# Patient Record
Sex: Female | Born: 1950 | Race: Black or African American | Hispanic: No | Marital: Single | Smoking: Never smoker
Health system: Southern US, Community
[De-identification: ages and names within clinical notes are randomized; demographics above are authoritative.]

## PROBLEM LIST (undated history)

## (undated) DIAGNOSIS — I10 Essential (primary) hypertension: Secondary | ICD-10-CM

## (undated) HISTORY — DX: Essential (primary) hypertension: I10

## (undated) HISTORY — PX: ABDOMINAL HYSTERECTOMY: SHX81

---

## 2005-07-04 ENCOUNTER — Ambulatory Visit (HOSPITAL_COMMUNITY): Admission: RE | Admit: 2005-07-04 | Discharge: 2005-07-04 | Payer: Self-pay | Admitting: Obstetrics & Gynecology

## 2005-07-10 ENCOUNTER — Ambulatory Visit (HOSPITAL_COMMUNITY): Admission: RE | Admit: 2005-07-10 | Discharge: 2005-07-10 | Payer: Self-pay | Admitting: Obstetrics & Gynecology

## 2005-07-27 ENCOUNTER — Encounter: Admission: RE | Admit: 2005-07-27 | Discharge: 2005-07-27 | Payer: Self-pay | Admitting: Obstetrics & Gynecology

## 2005-08-07 ENCOUNTER — Ambulatory Visit: Admission: RE | Admit: 2005-08-07 | Discharge: 2005-08-07 | Payer: Self-pay | Admitting: Gynecologic Oncology

## 2005-08-14 ENCOUNTER — Encounter (INDEPENDENT_AMBULATORY_CARE_PROVIDER_SITE_OTHER): Payer: Self-pay | Admitting: *Deleted

## 2005-08-14 ENCOUNTER — Inpatient Hospital Stay (HOSPITAL_COMMUNITY): Admission: RE | Admit: 2005-08-14 | Discharge: 2005-08-18 | Payer: Self-pay | Admitting: Obstetrics & Gynecology

## 2005-10-10 ENCOUNTER — Emergency Department (HOSPITAL_COMMUNITY): Admission: EM | Admit: 2005-10-10 | Discharge: 2005-10-10 | Payer: Self-pay | Admitting: Emergency Medicine

## 2007-06-25 ENCOUNTER — Encounter: Admission: RE | Admit: 2007-06-25 | Discharge: 2007-06-25 | Payer: Self-pay | Admitting: Family Medicine

## 2007-08-26 ENCOUNTER — Ambulatory Visit (HOSPITAL_COMMUNITY): Admission: RE | Admit: 2007-08-26 | Discharge: 2007-08-26 | Payer: Self-pay | Admitting: General Surgery

## 2007-09-26 ENCOUNTER — Ambulatory Visit: Admission: RE | Admit: 2007-09-26 | Discharge: 2007-09-26 | Payer: Self-pay | Admitting: General Surgery
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
## 2007-11-28 ENCOUNTER — Inpatient Hospital Stay (HOSPITAL_COMMUNITY): Admission: AD | Admit: 2007-11-28 | Discharge: 2007-12-02 | Payer: Self-pay | Admitting: General Surgery

## 2007-11-28 ENCOUNTER — Encounter (HOSPITAL_BASED_OUTPATIENT_CLINIC_OR_DEPARTMENT_OTHER): Payer: Self-pay | Admitting: General Surgery

## 2009-12-20 IMAGING — CR DG CHEST 2V
2 series · 2 of 2 positions shown · non-contrast
Comparison: 08/09/2005 study.

CLINICAL DATA: History given of coughing, asthma, tobacco smoking.
Preoperative cardiopulmonary evaluation.

CHEST - 2 VIEW

[view not recorded (1 of 2)]
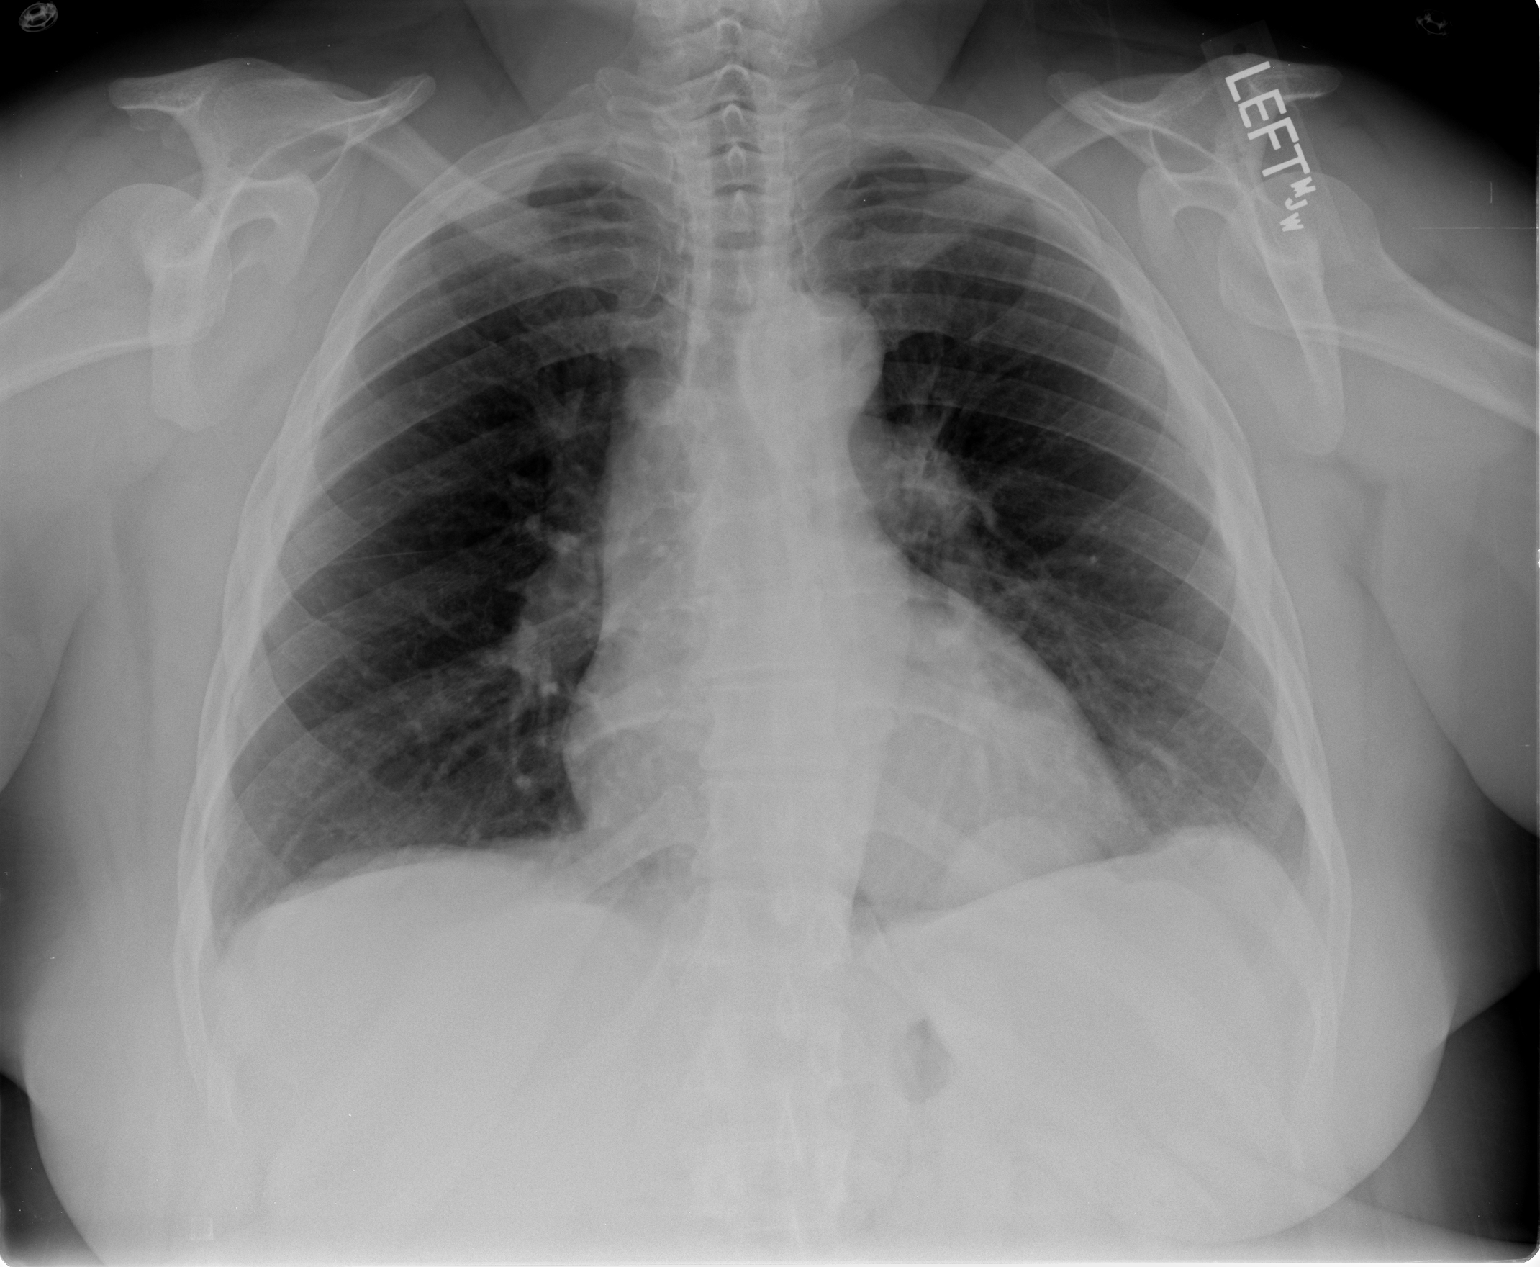

[view not recorded (2 of 2)]
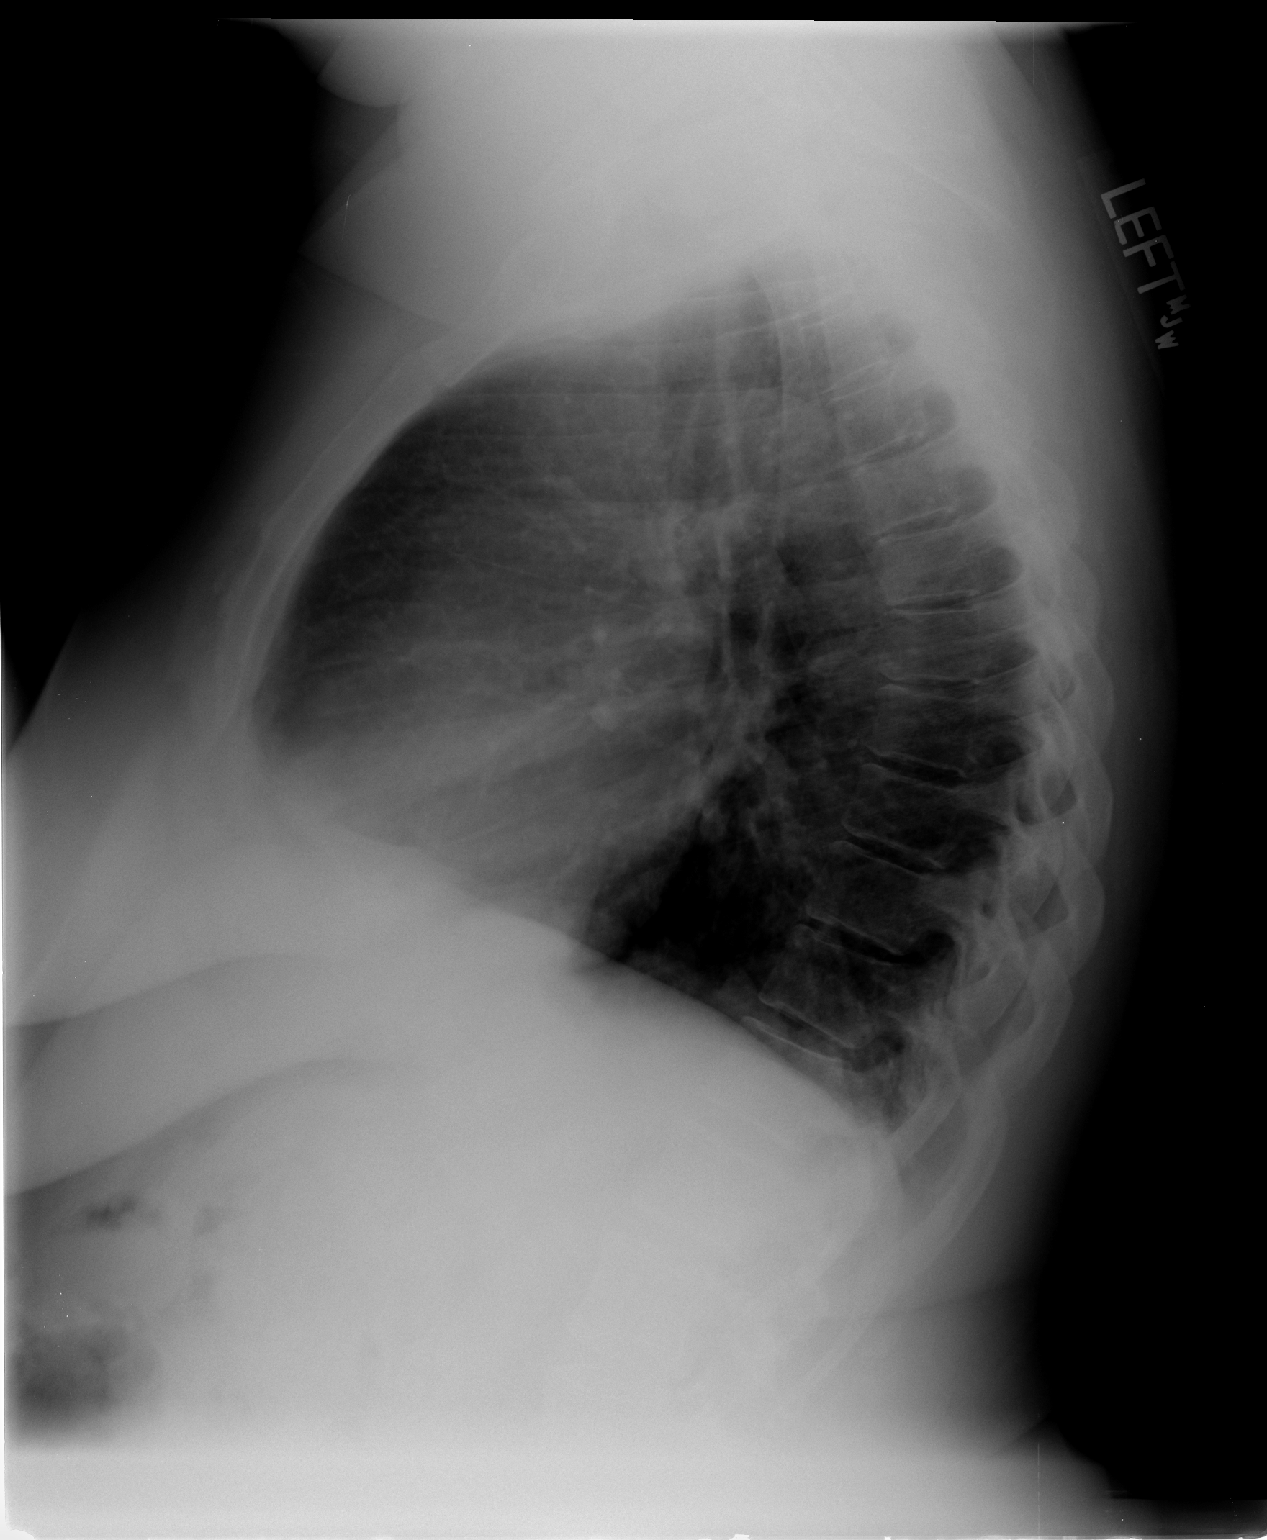

[2 of 2 positions shown; findings below may reference images not displayed]

FINDINGS: Cardiac silhouette is upper normal size.  No pulmonary
edema, pneumonia, or pleural effusion is seen.  There is a
generalized hyperinflation configuration.  There is flattening of
the diaphragm on lateral image.  There is minimal degenerative
spondylosis. On the PA image a structure projects superior to the
medial aspect of the left hemidiaphragm.  It has a curvilinear
smooth superior margin.  It measures 4.3 cm in transverse diameter.
I cannot definite identify this on the lateral image.  A CT was
performed on 06/25/2007.  On the sagittal images there is seen to
be protrusion of fat superiorly at the level of the posterior
aspect of the right hemidiaphragm .  On the coronal images there
appears to be a defect within the diaphragm with the fat protruding
through the defect consistent with a herniation of fat through the
posterior aspect of the left hemidiaphragm.  I feel this accounts
for the density seen on the chest radiographic examination.
IMPRESSION: Hyperinflation configuration with flattening of the diaphragm on
lateral image consistent with an element of obstructive pulmonary
disease.  No acute process identified. Left hemidiaphragmatic
posterior herniation of fat superiorly is seen.  This appears
unchanged from previous CT examination.

## 2010-02-05 ENCOUNTER — Encounter: Payer: Self-pay | Admitting: Obstetrics & Gynecology

## 2010-05-30 NOTE — Op Note (Signed)
NAMEEMERIE, VANDERKOLK                ACCOUNT NO.:  [id number]   MEDICAL RECORD NO.:  [id number]          PATIENT TYPE:  INP   LOCATION:  5123                         FACILITY:  [facility]   PHYSICIAN:  Leonie Man, M.D.   DATE OF BIRTH:  1950/11/13   DATE OF PROCEDURE:  11/28/2007  DATE OF DISCHARGE:  07/02/2007                               OPERATIVE REPORT   PREOPERATIVE DIAGNOSIS:  Incarcerated ventral hernia   POSTOPERATIVE DIAGNOSIS:  Incarcerated ventral hernia.   PROCEDURE:  Repair of incarcerated ventral hernia with Strattice  biologic mesh.   SURGEON:  Leonie Man, MD   ASSISTANT:  Lennie Muckle, MD   ANESTHESIA:  General.   INDICATIONS FOR PROCEDURE:  Ms. Allerton is a 60 year old lady with chronic  COPD who underwent exploratory laparotomy in the remote past for  adhesions.  She also had a hysterectomy and salpingo-oophorectomy.  She  subsequently developed a large ventral hernia extending through the  entire epigastrium down to and below the umbilicus.  She comes to the  operating room now after the risks and potential benefits of surgery  have been fully discussed, all questions answered, and consent obtained.   PROCEDURE IN DETAIL:  The patient was positioned supinely.  Following  the induction of satisfactory general anesthesia, the abdomen was  prepped and draped to be included in a sterile operative field after a  Foley catheter was placed in the urinary bladder for drainage.   Positive identification of the patient as Florrie Ramires and the operation  to be done as repair of ventral hernia were carried out.  There were no  equipment, airway, or other issues.  The patient received preoperative  antibiotics.   I made an incision in the old scar cicatrix deepening this through skin  and subcutaneous tissues down to a very large ventral hernia sac which  was multiloculated.  These were all dissected free from subcutaneous  tissues, carrying the dissection down to  the base of the sacs where they  were opened along the abdominal midline.  These were all excised in a  single group of hernias and the hernia sacs were forwarded for  pathologic evaluation.  Then underwent a fairly extensive adhesiolysis  of the intra-abdominal contents, freeing all contents up, so as to allow  for good placement of the mesh.  I then raised skin flaps laterally and  superiorly and did component release incisions at the external oblique  muscular fascia, all debrided away from the anterior superior iliac  spine up to the costal margins on both sides.  I then used a Strattice  20 x 20 cm inlay mesh which was sewn in approximately 5 cm from the  incision edge with interrupted #1 Novofil sutures.  These were all sewn  in place.  I then brought the fascial edges together over the Strattice  mesh and this was closed with a running #1 PDS suture.  All areas on the  flap were then diligently checked for hemostasis.  Sponge and instrument  counts were doubly verified.  I placed two 19-French Blake drains under  the flaps for additional drainage.  The subcutaneous tissues were then  closed with a running 2-0 Vicryl suture  and the skin was closed with running 4-0 Monocryl suture.  The drains  were attached to the skin with 3-0 nylon sutures and allowed to drain.  Sterile dressings were then applied.  The anesthetic reversed.  The  patient was removed from the operating room to the recovery room in  stable condition.  She tolerated the procedure well.      Leonie Man, M.D.  Electronically Signed     PB/MEDQ  D:  11/28/2007  T:  11/28/2007  Job:  [id number]

## 2010-06-02 NOTE — Discharge Summary (Signed)
NAMESHAKIYAH, CIRILO                ACCOUNT NO.:  [id number]   MEDICAL RECORD NO.:  [id number]          PATIENT TYPE:  INP   LOCATION:  5123                         FACILITY:  [facility]   PHYSICIAN:  Leonie Man, M.D.   DATE OF BIRTH:  Jun 20, 1950   DATE OF ADMISSION:  11/28/2007  DATE OF DISCHARGE:  12/02/2007                               DISCHARGE SUMMARY   ADMISSION DIAGNOSIS:  Incarcerated ventral hernia.   DISCHARGE DIAGNOSIS:  Incarcerated ventral hernia.   PROCEDURE:  Repair of incarcerated ventral hernia.  Complications,  postoperative ileus.   CONDITION ON DISCHARGE:  Improved.   Follow up in 1 week.   MEDICATIONS:  Vicodin 1-2 every 4 hours p.r.n.   HOSPITAL COURSE:  The patient is a 60 year old female with a history of  COPD and chronic bronchitis and asthma and morbid obesity.  She was  admitted to the hospital for repair of very large and incarcerated  ventral hernia.  This procedure was done on the day of admission and she  was admitted for continued observation.  She had persistent nausea and  bloating on her first postoperative day and was kept over and her  incision, however, was healing satisfactorily.  On day #3, the patient  began having normal bowel activity, her abdomen was softer, and she was  having bowel movements.  She was discharged on fourth postoperative day,  to be followed in the office in 1 week.      Leonie Man, M.D.  Electronically Signed     PB/MEDQ  D:  01/19/2008  T:  01/19/2008  Job:  [id number]

## 2010-06-02 NOTE — Op Note (Signed)
[name], [name]                ACCOUNT NO.:  [id number]   MEDICAL RECORD NO.:  [id number]          PATIENT TYPE:  INP   LOCATION:  0003                         FACILITY:  [facility]   PHYSICIAN:  Paola A. Duard Brady, MD    DATE OF BIRTH:  1950-01-17   DATE OF PROCEDURE:  08/14/2005  DATE OF DISCHARGE:                                 OPERATIVE REPORT   PREOPERATIVE DIAGNOSES:  1.  Large pelvic mass x2.  2.  Umbilical hernia.  3.  Fibroid uterus.   POSTOPERATIVE DIAGNOSES:  1.  Ovarian fibromas.  2.  Bilateral hydrosalpinx.  3.  Umbilical hernia.  4.  Uterine fibroids.   PROCEDURES:  1.  Exploratory laparotomy.  2.  Lysis of adhesions for 40 minutes.  3.  TAH-BSO.  4.  Umbilical hernia repair.   SURGEONS:  1.  Roseanna Rainbow, M.D.  2.  Paola A. Duard Brady, MD.   ASSISTANT:  Telford Nab, R.N.   ANESTHESIA:  General.   ESTIMATED BLOOD LOSS:  150 mL.   IV FLUIDS:  2400 mL.   URINE OUTPUT:  250 mL.   CYST FLUID:  7500 mL.   ASCITES:  250 mL.   COMPLICATIONS:  None.   DISPOSITION OF PATHOLOGY SPECIMENS:  To pathology.   The patient was taken to the operating room, placed in supine position where  general anesthesia was induced.  She was then placed in dorsal lithotomy  position with all appropriate precautions being taken.  Abdomen was then  prepped in the usual sterile fashion.  The perineum was prepped in the usual  sterile fashion.  A Foley catheter was inserted under sterile conditions.  The patient was then draped in the usual sterile fashion.  Time-out was  performed to ensure the procedure and allergies.   Operative findings at that time included a large 40 cm bilobed apparent left  adnexal mass.  There was significant adhesions of the omentum overlying this  mass and adherent to the fundus of the uterus.  The right ovary contained a  bilobed mass with a 10 cm solid component and approximately 10 cm cystic  component.  The corpus was about 10 weeks  size.  Remainder of the anatomy  was unremarkable.  The appendix was normal in appearance.  The patient had 2  cm umbilical hernia.  The vertical midline incision was made with the knife  and carried down to the underlying fascia.  The fascia was scored in the  midline and the fascial incision was extended superiorly and inferiorly  using Bovie cautery.  The mass was identified and findings as above were  noted.  The omental adhesions to the ovary and the fundus of the uterus were  taken down with Bovie cautery as they were quite filmy in most places.  For  the larger pedicles, they were clamped with Burlisher clamps, transected and  ligated with 2-0 Vicryl.  This continued until the entire omentum was free  from the uterus as well as the ovary.  We then identified an area of the  ovarian mass that appeared to be  most cystic.  Dry laps were placed around  and the ovarian mass was entered sharply.  It was drained of approximately  750 mL of brown clear fluid.  The ovary was then lifted out of the abdomen  and pelvis.  The utero-ovarian ligaments were skeletonized.  The IP was  identified at a portion above the ureter.  It was clamped x2, transected and  suture ligated.  The utero-ovarian in a similar fashion was clamped,  transected and suture ligated with 0 Vicryl.  The ovary was sent to frozen  section.  Frozen section returned consistent with an ovarian fibroma and a  large hydrosalpinx.   Our attention was then turned to the patient's right ovary.  At this point,  it was decided that we would be able to place a Bookwalter self-retaining  retractor.  The Bookwalter was attached to the bed with a short retractor  blades.  The small bowel and colon were packed out of the pelvis using moist  laparotomy sponges.  The patient was placed in Trendelenburg position.  A  window was made in the posterior leaf of the broad ligament on the patient's  right side with the Bovie cautery.  The ureter  was identified and a window  was made between the IP and the ureter.  The IP was clamped x2, transected  and suture ligated.  We then skeletonized the adhesive disease and freed up  the ovarian mass from its filmy adhesions to the right pelvic peritoneum,  bladder and rectosigmoid colon.  We then came across the mass of the utero-  ovarian and it was sent to frozen section.  Frozen section findings were  similar to that on the contralateral side.  Our attention was then drawn to  the uterus.  It was grasped at the cornua with Kelly clamps.  The round  ligaments on the patient's left side was transected using Bovie cautery.  The anterior and posterior leaf of the broad ligament were opened  completely.  The uterine artery was skeletonized.  The bladder flap was  created.  The uterine artery was clamped x2 with Mastersons, transected and  suture ligated with 0 Vicryl.  Similar procedure was performed on the  patient's right side.  We continued down the cardinal ligaments using curved  Masterson clamps, creating pedicles transecting and suture ligated.  This  was down to the level of the cervicovaginal isthmic junction.  We then came  across with Mastersons at the level of the cervix.  The cervix and uterus  were then amputated from the vagina and handed off for permanent section.  The vaginal cuff angles were suture ligated in figure-of-eight fashion with  0 Vicryl.  An additional figure-of-eight suture was used to close the  vagina.  The abdomen and pelvis were copiously irrigated.  All pedicles were  noted to be hemostatic.  The ureters noted to be free of the area of  dissection, nondilated and peristalsing.  The bowel was then unpacked from  the abdomen.  The omental pedicles were identified and were noted to be  hemostatic.  Survey of the abdomen and with the findings as above were noted.  The Bookwalter was removed.  The fascia was identified and closed in  a running mass closure of #1  PDS.  The umbilical hernia was identified.  The  redundant peritoneal tissue was freed from the hernia.  The fascial edges  were cleaned and the hernia was closed with figure-of-eight suture of 0  Vicryl.  We then continued to close the remainder of the fascia with #1 PDS  in a running mass closure.  The subcu tissues were copiously irrigated and  noted to be hemostatic.  The skin was closed using skin clips.   The patient tolerated the procedure well and was taken to the recovery room  in stable condition.  All instrument, lap and needle counts were correct x2.      Paola A. Duard Brady, MD  Electronically Signed     PAG/MEDQ  D:  08/14/2005  T:  08/14/2005  Job:  [id number]   cc:   Telford Nab, R.N.  [address]  [place], [place] [address]   Roseanna Rainbow, M.D.  Fax: [phone]

## 2010-06-02 NOTE — Discharge Summary (Signed)
NAMEMICHELLE, [name]                ACCOUNT NO.:  [id number]   MEDICAL RECORD NO.:  [id number]          PATIENT TYPE:  INP   LOCATION:  1612                         FACILITY:  [facility]   PHYSICIAN:  Roseanna Rainbow, M.D.DATE OF BIRTH:  [birth year]   DATE OF ADMISSION:  08/14/2005  DATE OF DISCHARGE:  08/18/2005                                 DISCHARGE SUMMARY   CHIEF COMPLAINT:  The patient is a 60 year old with a pelvic mass and  umbilical hernia who presents for operative intervention.  Please see the  dictated history and physical for further details.   HOSPITAL COURSE:  The patient was admitted and underwent exploratory  laparotomy with total abdominal hysterectomy, bilateral salpingo-  oophorectomy, lysis of adhesions, and umbilical hernia repair.  Please see  the dictated operative summary.  On postoperative day #1, the patient complained of a productive cough with  yellow sputum and wheezing.  T-max was 100.7. Her white blood cell count was  14,200.  On exam of her lungs there were diffuse rhonchi.  She reported a  history of tobacco use.  She was felt at this point to have a likely  bronchitis.  Respiratory therapy was consulted for assistance with improving  her pulmonary toilet.  Empiric antibiotics were started as well as an  inhaler.  On postoperative day #2, she was complaining of urinary frequency and she  had a T-max of 101.7 and her lung exam essentially was unchanged.  There was  no CVAT appreciated on exam.  She was tolerating a clear liquid diet.  On postoperative day #3, she reported flatus and her lung exam was somewhat  improved.  She was afebrile.  Her oxygen saturations had always remained  above 95% or .  However at this point, she was having difficulty urinating  and the Foley catheter was replaced for bladder rest.  UA and urine culture  and sensitivity were sent at this time.  On postoperative day #4, the catheter was removed.  A urinalysis was  suspicious for urinary tract infection.  She had greater than 3+ protein and  positive nitrites, leukocyte esterase, RBCs, and WBCs.  The patient was  taught how to use the leg bag and a Foley catheter was replaced.  She was  discharged to home.   DISCHARGE DIAGNOSES:  1. Ovarian fibromas.  2. Bilateral hydrosalpinges.  3. Benign endometrial polyps.  4. Multiple myomas.  5. Umbilical hernia.  6. Urinary retention/r/o UTI  7. Acute bronchitis.   PROCEDURES:  1. Exploratory laparotomy.  2. Lysis of adhesions.  3. Total abdominal hysterectomy and bilateral salpingo-oophorectomy.  4. Umbilical herniorrhaphy.   CONDITION:  Stable.   DIET:  Regular.   ACTIVITY:  No driving for 2 weeks, increase activity slowly, no sexual  activity for 6 weeks.   MEDICATIONS:  Included Levaquin, Percocet, ibuprofen, albuterol inhaler.   DISPOSITION:  The patient was to follow up in the office in 1 week.      Roseanna Rainbow, M.D.  Electronically Signed     LAJ/MEDQ  D:  08/29/2005  T:  08/29/2005  Job:  [id number]   cc:   Telford Nab, R.N.  [address]  [place], [place] [address]

## 2010-06-02 NOTE — Consult Note (Signed)
[name], [name]                ACCOUNT NO.:  [id number]   MEDICAL RECORD NO.:  [id number]          PATIENT TYPE:  OUT   LOCATION:  GYN                          FACILITY:  [facility]   PHYSICIAN:  John T. Kyla Balzarine, M.D.    DATE OF BIRTH:  07-04-1950   DATE OF CONSULTATION:  08/07/2005  DATE OF DISCHARGE:                                   CONSULTATION   CHIEF COMPLAINT:  Abdominal swelling and abdominopelvic mass.   HISTORY OF PRESENT ILLNESS:  The patient has noted gradual abdominal  enlargement/swelling which in retrospect she dates to the beginning of  school year almost 1 year ago.  She denies change in bowel or bladder  function, early satiety or obstructive type symptoms.  Denies back pain or  leg swelling.  She presented for a routine examination to Dr. Jean RosenthalChristell Constant, was found to have a large abdominopelvic mass and underwent CT of  abdomen and pelvis on 06/26.  This revealed a 33 x 20 cm cystic mass in the  central peritoneal cavity with free-flowing ascites, small right pleural  effusion and a 7.7 x 10.4 cm solid lesion identified in the right help hemi  pelvis.  Fibroid changes were noted in the uterus with a small amount of  fluid in the endometrial cavity.  Neither ovary was identified.  There is no  pathologic lymphadenopathy or peritoneal carcinomatosis noted.  Abdominal  viscera were normal.  Other evaluation included normal mammograms.  Her  hemogram was essentially normal with platelet count 246.  Metabolic panel  was normal in regards to renal functions and liver functions.  CA-125 value  was elevated to 232.4.  Past medical history is significant for no major  morbidity for major surgeries.  She underwent four NSVD and has one SAB.   MEDICATIONS:  None.   ALLERGIES:  None known.   PERSONAL AND SOCIAL HISTORY:  Single teacher, smokes one half-pack per day  times 40-50 years.  Rare ethanol use.   FAMILY HISTORY:  No breast, gynecologic or colon malignancy.   REVIEW OF SYSTEMS:  Other than noted above, negative in 10 comprehensive  systems.   EXAM:  VITAL SIGNS:  Weight 241 pounds, blood pressure 150/100, pulse 76,  respirations 18, temperature afebrile.  GENERAL:  The patient is anxious, alert and oriented x3 in no acute  distress.  ENT:  Is benign with clear oropharynx.  There is no scleral icterus.  Full  extraocular movements.  NECK:  Supple without goiter.  LUNGS:  Lung fields are clear to touch and auscultation.  HEART:  Sounds regular rate and rhythm without JVD.  ABDOMEN:  Is markedly distended with tense mass that extends into the  epigastrium from the pelvis.  There is minimal mobility.  The patient is  nontender and there are no hernias noted.  EXTREMITIES:  Full strength and range of motion.  SKIN:  No suspicious lesions.  NEUROLOGIC:  Screen intact.   External genitalia and BUS normal to inspection, palpation.  Bladder and  urethra are normal with small cystocele and rectocele.  Bimanual and  rectovaginal  examinations disclose normal cervix, deviated to the patient's  right.  There is a firm mass in the right hemi pelvis, approximately 6-7 cm  in diameter, with no cul-de-sac nodularity.  There is fullness of the cul-de-  sac, compatible with moderate ascites.  I am unable to delineate uterus  separate from the mass that arises from the pelvis and extends into the  abdomen.   ASSESSMENT:  Abdominopelvic simple cystic mass, fibroid uterus and solid  right adnexal mass associated with elevated CA-125 value.  Differential  diagnosis includes benign ovarian cystadenoma with pedunculated fibroid or  ovarian malignancy.   PLAN:  I have a long discussion with the patient regarding diagnostic  possibilities.  I recommended exploratory laparotomy with anticipated  TAH/BSO.  If she had a mobile cyst that was not attached to contiguous  organs, drainage and removal through a smaller midline incision might be  accomplished.  We  discuss staging and debulking of ovarian cancer, including  omentectomy and lymph node dissections.  We discussed the importance of  bowel prep in the event of bowel surgery.  Risks and benefits of surgery  were discussed length, questions answered and the patient wishes to proceed.  Surgery is tentatively scheduled for 07/31 with Dr. Duard Brady and Dr. Jean RosenthalChristell Constant.  The patient is aware that I will not be performing surgery her  surgery and expects to meet Dr. Duard Brady the day of surgery.  Preoperative  screening per routine.   SUGGESTIONS:  More and answer [name] T. Kyla Balzarine, M.D.  Electronically Signed     JTS/MEDQ  D:  08/07/2005  T:  08/08/2005  Job:  [id number]   cc:   Roseanna Rainbow, M.D.  Fax: [phone]   Telford Nab, R.N.  [address]  [place], [place] [address]

## 2010-10-13 LAB — BLOOD GAS, ARTERIAL
Bicarbonate: 31.2 — ABNORMAL HIGH
FIO2: 0.21
TCO2: 32.8
pCO2 arterial: 52 — ABNORMAL HIGH
pO2, Arterial: 64.2 — ABNORMAL LOW

## 2010-10-17 LAB — COMPREHENSIVE METABOLIC PANEL
ALT: 15
AST: 24
BUN: 6
Calcium: 7.9 — ABNORMAL LOW
Calcium: 8.8
Chloride: 102
Creatinine, Ser: 0.82
GFR calc non Af Amer: >60
Glucose, Bld: 126 — ABNORMAL HIGH
Glucose, Bld: 151 — ABNORMAL HIGH
Potassium: 4
Sodium: 137
Total Bilirubin: 0.6

## 2010-10-17 LAB — PROTIME-INR
INR: 1
Prothrombin Time: 12.8

## 2010-10-17 LAB — DIFFERENTIAL
Eosinophils Absolute: 0.1
Eosinophils Relative: 2
Lymphocytes Relative: 13
Lymphocytes Relative: 26
Lymphs Abs: 1.8
Lymphs Abs: 2.7
Monocytes Absolute: 0.5
Monocytes Relative: 7
Neutro Abs: 6.9
Neutrophils Relative %: 79 — ABNORMAL HIGH

## 2010-10-17 LAB — BASIC METABOLIC PANEL
BUN: 3 — ABNORMAL LOW
CO2: 31
Creatinine, Ser: 0.83
Glucose, Bld: 143 — ABNORMAL HIGH
Potassium: 3.6
Sodium: 137

## 2010-10-17 LAB — CBC
Hemoglobin: 13.5
MCHC: 32.4
MCHC: 32.9
MCV: 82.3
MCV: 83.9
Platelets: 166
Platelets: ADEQUATE
RBC: 4.36
RBC: 4.98
RDW: 14.5
RDW: 14.7
WBC: 16.6 — ABNORMAL HIGH

## 2010-10-17 LAB — TYPE AND SCREEN: Antibody Screen: NEGATIVE

## 2010-10-17 LAB — ABO/RH: ABO/RH(D): A POS

## 2011-05-21 ENCOUNTER — Encounter: Payer: BC Managed Care – PPO | Attending: Family Medicine | Admitting: *Deleted

## 2011-05-21 DIAGNOSIS — Z713 Dietary counseling and surveillance: Secondary | ICD-10-CM | POA: Insufficient documentation

## 2011-05-21 DIAGNOSIS — E119 Type 2 diabetes mellitus without complications: Secondary | ICD-10-CM | POA: Insufficient documentation

## 2011-05-23 ENCOUNTER — Encounter: Payer: Self-pay | Admitting: *Deleted

## 2011-05-23 NOTE — Progress Notes (Signed)
  Patient was seen on 05/21/2011 for the first of a series of three diabetes self-management courses at the [facility]. The following learning objectives were met by the patient during this course:   Defines the role of glucose and insulin  Identifies type of diabetes and pathophysiology  Defines the diagnostic criteria for diabetes and prediabetes  States the risk factors for Type 2 Diabetes  States the symptoms of Type 2 Diabetes  Defines Type 2 Diabetes treatment goals  Defines Type 2 Diabetes treatment options  States the rationale for glucose monitoring  Identifies A1C, glucose targets, and testing times  Identifies proper sharps disposal  Defines the purpose of a diabetes food plan  Identifies carbohydrate food groups  Defines effects of carbohydrate foods on glucose levels  Identifies carbohydrate choices/grams/food labels  States benefits of physical activity and effect on glucose  Review of suggested activity guidelines  Handouts given during class include:  Type 2 Diabetes: Basics Book  My Food Plan Book  Food and Activity Log  Follow-Up Plan: Core Class 2 within next month

## 2011-05-23 NOTE — Patient Instructions (Signed)
Goals:  Follow Diabetes Meal Plan as instructed  Eat 3 meals and 2 snacks, every 3-5 hrs  Limit carbohydrate intake to 30-45 grams carbohydrate/meal  Limit carbohydrate intake to 0-15 grams carbohydrate/snack  Add lean protein foods to meals/snacks  Monitor glucose levels as instructed by your doctor  Aim for 30 mins of physical activity daily as tolerated  Bring food record and glucose log to your next nutrition visit  

## 2011-06-04 ENCOUNTER — Ambulatory Visit: Payer: Self-pay | Admitting: *Deleted

## 2011-08-21 ENCOUNTER — Encounter: Payer: BC Managed Care – PPO | Attending: Family Medicine | Admitting: *Deleted

## 2011-08-21 DIAGNOSIS — E119 Type 2 diabetes mellitus without complications: Secondary | ICD-10-CM

## 2011-08-21 DIAGNOSIS — Z713 Dietary counseling and surveillance: Secondary | ICD-10-CM | POA: Insufficient documentation

## 2011-08-21 NOTE — Progress Notes (Signed)
  Patient was seen on 08/21/11 for the second of a series of three diabetes self-management courses at the [facility]. The following learning objectives were met by the patient during this course:   Explain basic nutrition maintenance and quality assurance  Describe causes, symptoms and treatment of hypoglycemia and hyperglycemia  Explain how to manage diabetes during illness  Describe the importance of good nutrition for health and healthy eating strategies  List strategies to follow meal plan when dining out  Describe the effects of alcohol on glucose and how to use it safely  Describe problem solving skills for day-to-day glucose challenges  Describe strategies to use when treatment plan needs to change  Identify important factors involved in successful weight loss  Describe ways to remain physically active  Describe the impact of regular activity on insulin resistance   Handouts given in class:  Tips for weight loss  [facility] Oral medication/insulin handout  Follow-Up Plan: Patient will attend the final class of the ADA Diabetes Self-Care Education.   

## 2011-09-04 ENCOUNTER — Encounter: Payer: BC Managed Care – PPO | Admitting: Dietician

## 2011-09-04 DIAGNOSIS — E119 Type 2 diabetes mellitus without complications: Secondary | ICD-10-CM

## 2011-09-06 NOTE — Progress Notes (Signed)
  Patient was seen on 008/20/2013 for the third of a series of three diabetes self-management courses at the [facility]. The following learning objectives were met by the patient during this course:    Describe how diabetes changes over time   Identify diabetes complications and ways to prevent them   Describe strategies that can promote heart health including lowering blood pressure and cholesterol   Describe strategies to lower dietary fat and sodium in the diet   Identify physical activities that benefit cardiovascular health   Evaluate success in meeting personal goal   Describe the belief that they can live successfully with diabetes day to day   Establish 2-3 goals that they will plan to diligently work on until they return for the free 5-month follow-up visit  The following handouts were given in class:  3 Month Follow Up Visit handout  Goal setting handout  Class evaluation form  Your patient has established the following 3 month goals for diabetes self-care:  Count carbohydrates at most of my meals and snacks.  Take diabetes medications as scheduled.  Test my glucose at least 1 time per day, 7 days a week.  Look for patterns in my record book at least 5 days a month.  Follow-Up Plan: Patient will attend a 3 month follow-up visit for diabetes self-management education.

## 2011-11-21 ENCOUNTER — Ambulatory Visit: Payer: BC Managed Care – PPO | Admitting: Dietician

## 2019-07-01 ENCOUNTER — Other Ambulatory Visit: Payer: Self-pay | Admitting: Family Medicine

## 2019-07-01 DIAGNOSIS — Z1231 Encounter for screening mammogram for malignant neoplasm of breast: Secondary | ICD-10-CM

## 2019-07-16 ENCOUNTER — Ambulatory Visit: Payer: BC Managed Care – PPO

## 2021-03-06 ENCOUNTER — Ambulatory Visit: Payer: BC Managed Care – PPO | Admitting: Podiatry

## 2021-03-27 ENCOUNTER — Other Ambulatory Visit: Payer: Self-pay

## 2021-03-27 ENCOUNTER — Encounter: Payer: Self-pay | Admitting: Podiatrist

## 2021-03-27 ENCOUNTER — Ambulatory Visit: Payer: Medicare PPO | Admitting: Podiatrist

## 2021-03-27 DIAGNOSIS — E119 Type 2 diabetes mellitus without complications: Secondary | ICD-10-CM | POA: Diagnosis not present

## 2021-03-27 DIAGNOSIS — M2041 Other hammer toe(s) (acquired), right foot: Secondary | ICD-10-CM

## 2021-03-27 DIAGNOSIS — M2042 Other hammer toe(s) (acquired), left foot: Secondary | ICD-10-CM | POA: Diagnosis not present

## 2021-03-27 DIAGNOSIS — M7751 Other enthesopathy of right foot: Secondary | ICD-10-CM

## 2021-03-27 DIAGNOSIS — M778 Other enthesopathies, not elsewhere classified: Secondary | ICD-10-CM

## 2021-03-27 NOTE — Patient Instructions (Signed)

## 2021-03-27 NOTE — Progress Notes (Signed)
°  Chief Complaint  Patient presents with   Diabetes    Foot exam      HPI: Patient is 71 y.o. female who presents today for a diabetic foot evaluation and for nail care.  She denies any subjective complaints of neuropathy and relates overall she states her diabetes is under good control.  There are no problems to display for this patient.   Current Outpatient Medications on File Prior to Visit  Medication Sig Dispense Refill   sitaGLIPtan-metformin (JANUMET) 50-500 MG per tablet Take 1 tablet by mouth 2 (two) times daily with a meal.     valsartan (DIOVAN) 40 MG tablet Take 40 mg by mouth daily.     No current facility-administered medications on file prior to visit.    No Known Allergies  Review of Systems No fevers, chills, nausea, muscle aches, no difficulty breathing, no calf pain, no chest pain or shortness of breath.   Physical Exam  GENERAL APPEARANCE: Alert, conversant. Appropriately groomed. No acute distress.   VASCULAR: Pedal pulses palpable DP and PT bilateral.  Capillary refill time is immediate to all digits,  Proximal to distal cooling it warm to warm.  Digital perfusion adequate.   NEUROLOGIC: sensation is intact to 5.07 monofilament at 5/5 sites bilateral.  Light touch is intact bilateral, vibratory sensation intact bilateral  MUSCULOSKELETAL: acceptable muscle strength, tone and stability bilateral.  Crossover hammertoe deformities noted on the second bilateral.  Rectus foot type is seen.  No pain, crepitus or limitation noted with foot and ankle range of motion bilateral.  Mild discomfort along the lateral side of the left foot is noted at the fifth metatarsal base.  DERMATOLOGIC: skin is warm, supple, and dry.  No open lesions noted.  No rash, no pre ulcerative lesions.  Digital nails are slightly elongated and discolored x 10.   Assessment     ICD-10-CM   1. Encounter for diabetic foot exam (HCC)  E11.9     2. Hammertoe, bilateral  M20.41    M20.42      3. Capsulitis of foot, left  M77.8          Plan  Discussed exam findings with the patient.  Recommended she continue to wear her good Brooks running shoes and discussed that we could consider orthotics at the lateral aspect of the left foot begins to hurt again.  Likely her other shoes were worn out and when she replaced them with no shoes the foot pain improved.  Discussed the crossover second toes and recommended no treatment at this time as they are not bothersome to her.  Overall her feet look great and I recommended she call with any problems in the future.

## 2022-09-06 ENCOUNTER — Emergency Department (HOSPITAL_BASED_OUTPATIENT_CLINIC_OR_DEPARTMENT_OTHER): Payer: Medicare PPO

## 2022-09-06 ENCOUNTER — Inpatient Hospital Stay (HOSPITAL_BASED_OUTPATIENT_CLINIC_OR_DEPARTMENT_OTHER)
Admission: EM | Admit: 2022-09-06 | Discharge: 2022-09-12 | DRG: 291 | Disposition: A | Discharge status: Home / Self Care | Payer: Medicare PPO | Attending: Family Medicine | Admitting: Family Medicine

## 2022-09-06 ENCOUNTER — Encounter (HOSPITAL_BASED_OUTPATIENT_CLINIC_OR_DEPARTMENT_OTHER): Payer: Self-pay

## 2022-09-06 DIAGNOSIS — N179 Acute kidney failure, unspecified: Secondary | ICD-10-CM | POA: Diagnosis present

## 2022-09-06 DIAGNOSIS — E8809 Other disorders of plasma-protein metabolism, not elsewhere classified: Secondary | ICD-10-CM | POA: Diagnosis not present

## 2022-09-06 DIAGNOSIS — E876 Hypokalemia: Secondary | ICD-10-CM | POA: Diagnosis present

## 2022-09-06 DIAGNOSIS — Z6841 Body Mass Index (BMI) 40.0 and over, adult: Secondary | ICD-10-CM

## 2022-09-06 DIAGNOSIS — I77810 Thoracic aortic ectasia: Secondary | ICD-10-CM | POA: Diagnosis present

## 2022-09-06 DIAGNOSIS — E1165 Type 2 diabetes mellitus with hyperglycemia: Secondary | ICD-10-CM | POA: Diagnosis not present

## 2022-09-06 DIAGNOSIS — J9811 Atelectasis: Secondary | ICD-10-CM | POA: Diagnosis present

## 2022-09-06 DIAGNOSIS — Z79899 Other long term (current) drug therapy: Secondary | ICD-10-CM

## 2022-09-06 DIAGNOSIS — J45909 Unspecified asthma, uncomplicated: Secondary | ICD-10-CM | POA: Diagnosis present

## 2022-09-06 DIAGNOSIS — J9601 Acute respiratory failure with hypoxia: Secondary | ICD-10-CM | POA: Diagnosis present

## 2022-09-06 DIAGNOSIS — Z8249 Family history of ischemic heart disease and other diseases of the circulatory system: Secondary | ICD-10-CM

## 2022-09-06 DIAGNOSIS — J159 Unspecified bacterial pneumonia: Secondary | ICD-10-CM | POA: Diagnosis present

## 2022-09-06 DIAGNOSIS — Z7984 Long term (current) use of oral hypoglycemic drugs: Secondary | ICD-10-CM

## 2022-09-06 DIAGNOSIS — B029 Zoster without complications: Secondary | ICD-10-CM | POA: Diagnosis present

## 2022-09-06 DIAGNOSIS — J918 Pleural effusion in other conditions classified elsewhere: Secondary | ICD-10-CM | POA: Diagnosis present

## 2022-09-06 DIAGNOSIS — M79604 Pain in right leg: Secondary | ICD-10-CM | POA: Diagnosis not present

## 2022-09-06 DIAGNOSIS — I110 Hypertensive heart disease with heart failure: Secondary | ICD-10-CM | POA: Diagnosis not present

## 2022-09-06 DIAGNOSIS — R0602 Shortness of breath: Secondary | ICD-10-CM

## 2022-09-06 DIAGNOSIS — J189 Pneumonia, unspecified organism: Secondary | ICD-10-CM | POA: Diagnosis not present

## 2022-09-06 DIAGNOSIS — R0902 Hypoxemia: Principal | ICD-10-CM

## 2022-09-06 DIAGNOSIS — T380X5A Adverse effect of glucocorticoids and synthetic analogues, initial encounter: Secondary | ICD-10-CM | POA: Diagnosis not present

## 2022-09-06 DIAGNOSIS — J440 Chronic obstructive pulmonary disease with acute lower respiratory infection: Secondary | ICD-10-CM | POA: Diagnosis present

## 2022-09-06 DIAGNOSIS — I5031 Acute diastolic (congestive) heart failure: Secondary | ICD-10-CM | POA: Diagnosis present

## 2022-09-06 DIAGNOSIS — Z87891 Personal history of nicotine dependence: Secondary | ICD-10-CM

## 2022-09-06 DIAGNOSIS — Z791 Long term (current) use of non-steroidal anti-inflammatories (NSAID): Secondary | ICD-10-CM

## 2022-09-06 DIAGNOSIS — I10 Essential (primary) hypertension: Secondary | ICD-10-CM | POA: Diagnosis present

## 2022-09-06 DIAGNOSIS — Z1152 Encounter for screening for COVID-19: Secondary | ICD-10-CM

## 2022-09-06 DIAGNOSIS — Z825 Family history of asthma and other chronic lower respiratory diseases: Secondary | ICD-10-CM

## 2022-09-06 LAB — CBC WITH DIFFERENTIAL/PLATELET
Abs Immature Granulocytes: 0.1 10*3/uL — ABNORMAL HIGH (ref 0.00–0.07)
Basophils Absolute: 0.1 10*3/uL (ref 0.0–0.1)
Basophils Relative: 0 %
Eosinophils Absolute: 0.1 10*3/uL (ref 0.0–0.5)
Eosinophils Relative: 1 %
HCT: 39.8 % (ref 36.0–46.0)
Hemoglobin: 12.8 g/dL (ref 12.0–15.0)
Immature Granulocytes: 1 %
Lymphocytes Relative: 9 %
Lymphs Abs: 1.2 10*3/uL (ref 0.7–4.0)
MCH: 26 pg (ref 26.0–34.0)
MCHC: 32.2 g/dL (ref 30.0–36.0)
MCV: 80.7 fL (ref 80.0–100.0)
Monocytes Absolute: 1.1 10*3/uL — ABNORMAL HIGH (ref 0.1–1.0)
Monocytes Relative: 8 %
Neutro Abs: 11.8 10*3/uL — ABNORMAL HIGH (ref 1.7–7.7)
Neutrophils Relative %: 81 %
Platelets: 181 10*3/uL (ref 150–400)
RBC: 4.93 MIL/uL (ref 3.87–5.11)
RDW: 15.2 % (ref 11.5–15.5)
WBC: 14.3 10*3/uL — ABNORMAL HIGH (ref 4.0–10.5)
nRBC: 0 % (ref 0.0–0.2)

## 2022-09-06 LAB — BASIC METABOLIC PANEL
Anion gap: 8 (ref 5–15)
BUN: 26 mg/dL — ABNORMAL HIGH (ref 8–23)
CO2: 35 mmol/L — ABNORMAL HIGH (ref 22–32)
Calcium: 8.9 mg/dL (ref 8.9–10.3)
Chloride: 97 mmol/L — ABNORMAL LOW (ref 98–111)
Creatinine, Ser: 0.95 mg/dL (ref 0.44–1.00)
GFR, Estimated: >60 mL/min (ref >60)
Glucose, Bld: 208 mg/dL — ABNORMAL HIGH (ref 70–99)
Potassium: 3.3 mmol/L — ABNORMAL LOW (ref 3.5–5.1)
Sodium: 140 mmol/L (ref 135–145)

## 2022-09-06 LAB — RESP PANEL BY RT-PCR (RSV, FLU A&B, COVID)  RVPGX2
Influenza A by PCR: NEGATIVE
Influenza B by PCR: NEGATIVE
Resp Syncytial Virus by PCR: NEGATIVE
SARS Coronavirus 2 by RT PCR: NEGATIVE

## 2022-09-06 LAB — BRAIN NATRIURETIC PEPTIDE: B Natriuretic Peptide: 53 pg/mL (ref 0.0–100.0)

## 2022-09-06 LAB — LACTIC ACID, PLASMA: Lactic Acid, Venous: 1 mmol/L (ref 0.5–1.9)

## 2022-09-06 LAB — TROPONIN I (HIGH SENSITIVITY): Troponin I (High Sensitivity): 11 ng/L (ref <18)

## 2022-09-06 MED ORDER — ACETAMINOPHEN 325 MG PO TABS
650.0000 mg | ORAL_TABLET | Freq: Once | ORAL | Status: AC
  Administered 2022-09-06: 650 mg via ORAL
  Filled 2022-09-06: qty 2

## 2022-09-06 MED ORDER — SODIUM CHLORIDE 0.9 % IV SOLN
1.0000 g | Freq: Once | INTRAVENOUS | Status: AC
  Administered 2022-09-06: 1 g via INTRAVENOUS
  Filled 2022-09-06: qty 10

## 2022-09-06 MED ORDER — IPRATROPIUM-ALBUTEROL 0.5-2.5 (3) MG/3ML IN SOLN
3.0000 mL | Freq: Once | RESPIRATORY_TRACT | Status: AC
  Administered 2022-09-06: 3 mL via RESPIRATORY_TRACT
  Filled 2022-09-06: qty 3

## 2022-09-06 MED ORDER — SODIUM CHLORIDE 0.9 % IV SOLN
500.0000 mg | Freq: Once | INTRAVENOUS | Status: AC
  Administered 2022-09-06: 500 mg via INTRAVENOUS
  Filled 2022-09-06: qty 5

## 2022-09-06 MED ORDER — ALBUTEROL SULFATE HFA 108 (90 BASE) MCG/ACT IN AERS: 2.0000 | INHALATION_SPRAY | RESPIRATORY_TRACT | Status: DC | PRN

## 2022-09-06 MED ORDER — LEVALBUTEROL HCL 0.63 MG/3ML IN NEBU
0.6300 mg | INHALATION_SOLUTION | Freq: Once | RESPIRATORY_TRACT | Status: AC
  Administered 2022-09-06: 0.63 mg via RESPIRATORY_TRACT
  Filled 2022-09-06: qty 3

## 2022-09-06 MED ORDER — SODIUM CHLORIDE 0.9 % IV SOLN: Freq: Once | INTRAVENOUS | Status: AC

## 2022-09-06 NOTE — ED Notes (Signed)
RT Note: Patient had to be place don 3lpm [place] due to having a severely decreased oxygen saturation of 66% RA. Patient saturation on 3lpm [place] 92% currently

## 2022-09-06 NOTE — ED Triage Notes (Signed)
Pt c/o SHOB "since at least Saturday- worsening today." Went to [facility] just PTA, cxr was inconclusive "but showed mucus build up," Hx COPD  Pt placed on L [place] in triage, "really diminished" in triage

## 2022-09-06 NOTE — ED Provider Notes (Addendum)
[place] EMERGENCY DEPARTMENT AT [facility] Provider Note   CSN: [id number] Arrival date & time: 09/06/22  1818     History  Chief Complaint  Patient presents with   Shortness of Breath    [name] is a 72 y.o. female.  HPI   72 year old female presents to the emergency department with concern for weakness, shortness of breath, cough and fever/chills.  This has been ongoing for about a week.  She states that she was evaluated as an outpatient had a checks x-ray that was reported as "inconclusive" but she was not placed on any medication.  She has history of COPD but no oxygen requirement.  She is otherwise been compliant with her medications.  She denies any GI symptoms.  She has no ongoing chest pain/tightness but does feel short of breath.  No acute swelling of her lower extremities.  Home Medications Prior to Admission medications   Medication Sig Start Date End Date Taking? Authorizing Provider  sitaGLIPtan-metformin (JANUMET) 50-500 MG per tablet Take 1 tablet by mouth 2 (two) times daily with a meal.    [provider]  valsartan (DIOVAN) 40 MG tablet Take 40 mg by mouth daily.    [provider]      Allergies    Patient has no known allergies.    Review of Systems   Review of Systems  Constitutional:  Positive for chills, fatigue and fever.  Respiratory:  Positive for cough and wheezing. Negative for shortness of breath.   Cardiovascular:  Negative for chest pain, palpitations and leg swelling.  Gastrointestinal:  Negative for abdominal pain, diarrhea and vomiting.  Skin:  Negative for rash.  Neurological:  Negative for headaches.    Physical Exam Updated Vital Signs BP (!) 130/110   Pulse (!) 103   Temp (!) 101 F (38.3 C)   Resp 16   SpO2 95%  Physical Exam Vitals and nursing note reviewed.  Constitutional:      Appearance: Normal appearance. She is ill-appearing.  HENT:     Head: Normocephalic.     Mouth/Throat:      Mouth: Mucous membranes are moist.  Cardiovascular:     Rate and Rhythm: Normal rate.  Pulmonary:     Effort: Tachypnea present.     Breath sounds: Examination of the right-lower field reveals rhonchi and rales. Decreased breath sounds, rhonchi and rales present.  Abdominal:     Palpations: Abdomen is soft.     Tenderness: There is no abdominal tenderness.  Musculoskeletal:     Comments: Trace edema of the bilateral ankles  Skin:    General: Skin is warm.  Neurological:     Mental Status: She is alert and oriented to person, place, and time. Mental status is at baseline.  Psychiatric:        Mood and Affect: Mood normal.     ED Results / Procedures / Treatments   Labs (all labs ordered are listed, but only abnormal results are displayed) Labs Reviewed  CBC WITH DIFFERENTIAL/PLATELET - Abnormal; Notable for the following components:      Result Value   WBC 14.3 (*)    Neutro Abs 11.8 (*)    Monocytes Absolute 1.1 (*)    Abs Immature Granulocytes 0.10 (*)    All other components within normal limits  BASIC METABOLIC PANEL - Abnormal; Notable for the following components:   Potassium 3.3 (*)    Chloride 97 (*)    CO2 35 (*)  Glucose, Bld 208 (*)    BUN 26 (*)    All other components within normal limits  RESP PANEL BY RT-PCR (RSV, FLU A&B, COVID)  RVPGX2  CULTURE, BLOOD (ROUTINE X 2)  CULTURE, BLOOD (ROUTINE X 2)  BRAIN NATRIURETIC PEPTIDE  LACTIC ACID, PLASMA  LACTIC ACID, PLASMA  TROPONIN I (HIGH SENSITIVITY)    EKG EKG Interpretation Date/Time:  Thursday September 06 2022 18:31:54 EDT Ventricular Rate:  98 PR Interval:  142 QRS Duration:  74 QT Interval:  342 QTC Calculation: 436 R Axis:   85  Text Interpretation: Sinus rhythm with Fusion complexes Otherwise normal ECG When compared with ECG of 27-Nov-2007 10:59, Fusion complexes are now Present Confirmed by Coralee Pesa [phone]) on 09/06/2022 7:21:38 PM  Radiology DG Chest Port 1 View  Result Date:  09/06/2022 CLINICAL DATA:  Chest pain EXAM: PORTABLE CHEST 1 VIEW COMPARISON:  X-ray 11/27/2007 FINDINGS: Enlarged cardiopericardial silhouette. Small effusion on the right-greater-than-left. Adjacent opacity. Vascular congestion with some edema. No pneumothorax. Degenerative changes of the spine. Overlapping cardiac leads. IMPRESSION: Enlarged cardiopericardial silhouette with vascular congestion. Small pleural effusions with adjacent opacity, right-greater-than-left. Atelectasis versus infiltrate. Recommend follow-up Electronically Signed   By: Karen Kays M.D.   On: 09/06/2022 19:58    Procedures .Critical Care  Performed by: Rozelle Logan, DO Authorized by: Rozelle Logan, DO   Critical care provider statement:    Critical care time (minutes):  45   Critical care time was exclusive of:  Separately billable procedures and treating other patients   Critical care was necessary to treat or prevent imminent or life-threatening deterioration of the following conditions:  Respiratory failure   Critical care was time spent personally by me on the following activities:  Development of treatment plan with patient or surrogate, discussions with consultants, evaluation of patient's response to treatment, examination of patient, ordering and review of laboratory studies, ordering and review of radiographic studies, ordering and performing treatments and interventions, pulse oximetry, re-evaluation of patient's condition and review of old charts   I assumed direction of critical care for this patient from another provider in my specialty: no     Care discussed with: admitting provider       Medications Ordered in ED Medications  albuterol (VENTOLIN HFA) 108 (90 Base) MCG/ACT inhaler 2 puff (has no administration in time range)  ipratropium-albuterol (DUONEB) 0.5-2.5 (3) MG/3ML nebulizer solution 3 mL (3 mLs Nebulization Given 09/06/22 1844)  levalbuterol (XOPENEX) nebulizer solution 0.63 mg (0.63  mg Nebulization Given 09/06/22 1942)  acetaminophen (TYLENOL) tablet 650 mg (650 mg Oral Given 09/06/22 1930)    ED Course/ Medical Decision Making/ A&P                                 Medical Decision Making Amount and/or Complexity of Data Reviewed Radiology: ordered.  Risk Prescription drug management.   72 year old female presents emergency department with shortness of breath, intermittently productive cough and fever.  She is febrile and tachycardic on arrival, stable blood pressure.  Noted to be hypoxic to 89% on room air.  Currently improved on 4 L nasal cannula which is a new requirement for her.  Workup reveals a mild leukocytosis.  Lactic acid is normal.  Respiratory panel is negative.  However chest x-ray is showing findings of pulmonary vascular congestion as well as focal airspace disease in the right lower lobe that is most likely pneumonia.  Initially  septic order panel not placed through triage.  However patient has findings of SIRS/sepsis.  Septic protocol fluids not done given the pulmonary vascular congestion finding and possible heart failure.  Fever and tachycardia has resolved.  Troponin and BNP are negative.  After breathing treatment patient is improved.  Fever has resolved with antipyretic.  Will plan to treat with antibiotics for pneumonia and admit for hypoxia.  Patients evaluation and results requires admission for further treatment and care.  Spoke with hospitalist, reviewed patient's ED course and they accept admission.  Patient agrees with admission plan, offers no new complaints and is stable/unchanged at time of admit.        Final Clinical Impression(s) / ED Diagnoses Final diagnoses:  None    Rx / DC Orders ED Discharge Orders     None         Rozelle Logan, DO 09/06/22 2232    Rozelle Logan, DO 09/06/22 2233

## 2022-09-06 NOTE — Progress Notes (Addendum)
Plan of Care Note for accepted transfer   Patient: [name] MRN: [id number]   DOA: 09/06/2022  Facility requesting transfer: [facility]   Requesting Provider: Dr. Wilkie Aye  Reason for transfer: Pneumonia   Facility course: 72 year old female with HTN and COPD who presents with worsening shortness of breath over the past week with cough, fever, and chills.  She is found to be febrile and mildly tachycardic and tachypneic.  She was hypoxic on room air.  WBCs 14,300, lactic acid normal, BNP and troponin normal, and respiratory virus panel negative.  Chest x-ray demonstrates vascular congestion with small pleural effusions and atelectasis versus infiltrate.  Blood culture was collected and she was given Rocephin, azithromycin, nebulized breathing treatments, acetaminophen, and supplemental oxygen.  Plan of care: The patient is accepted for admission to Telemetry unit, at [facility].   Author: Briscoe Deutscher, MD 09/06/2022  Check [email or web address] for on-call coverage.  Nursing staff, Please call [facility] Admits & Consults System-Wide number on Amion as soon as patient's arrival, so appropriate admitting provider can evaluate the pt.

## 2022-09-07 ENCOUNTER — Encounter (HOSPITAL_COMMUNITY): Payer: Self-pay | Admitting: Family Medicine

## 2022-09-07 ENCOUNTER — Inpatient Hospital Stay (HOSPITAL_COMMUNITY): Payer: Medicare PPO

## 2022-09-07 DIAGNOSIS — E876 Hypokalemia: Secondary | ICD-10-CM | POA: Diagnosis present

## 2022-09-07 DIAGNOSIS — Z79899 Other long term (current) drug therapy: Secondary | ICD-10-CM | POA: Diagnosis not present

## 2022-09-07 DIAGNOSIS — J9811 Atelectasis: Secondary | ICD-10-CM | POA: Diagnosis present

## 2022-09-07 DIAGNOSIS — J189 Pneumonia, unspecified organism: Secondary | ICD-10-CM | POA: Diagnosis present

## 2022-09-07 DIAGNOSIS — J441 Chronic obstructive pulmonary disease with (acute) exacerbation: Secondary | ICD-10-CM | POA: Diagnosis not present

## 2022-09-07 DIAGNOSIS — Z7984 Long term (current) use of oral hypoglycemic drugs: Secondary | ICD-10-CM | POA: Diagnosis not present

## 2022-09-07 DIAGNOSIS — T380X5A Adverse effect of glucocorticoids and synthetic analogues, initial encounter: Secondary | ICD-10-CM | POA: Diagnosis not present

## 2022-09-07 DIAGNOSIS — M79604 Pain in right leg: Secondary | ICD-10-CM | POA: Diagnosis not present

## 2022-09-07 DIAGNOSIS — I5031 Acute diastolic (congestive) heart failure: Secondary | ICD-10-CM

## 2022-09-07 DIAGNOSIS — N179 Acute kidney failure, unspecified: Secondary | ICD-10-CM | POA: Diagnosis present

## 2022-09-07 DIAGNOSIS — J159 Unspecified bacterial pneumonia: Secondary | ICD-10-CM | POA: Diagnosis present

## 2022-09-07 DIAGNOSIS — Z6841 Body Mass Index (BMI) 40.0 and over, adult: Secondary | ICD-10-CM | POA: Diagnosis not present

## 2022-09-07 DIAGNOSIS — J440 Chronic obstructive pulmonary disease with acute lower respiratory infection: Secondary | ICD-10-CM | POA: Diagnosis present

## 2022-09-07 DIAGNOSIS — Z8249 Family history of ischemic heart disease and other diseases of the circulatory system: Secondary | ICD-10-CM | POA: Diagnosis not present

## 2022-09-07 DIAGNOSIS — J9601 Acute respiratory failure with hypoxia: Secondary | ICD-10-CM | POA: Diagnosis present

## 2022-09-07 DIAGNOSIS — Z1152 Encounter for screening for COVID-19: Secondary | ICD-10-CM | POA: Diagnosis not present

## 2022-09-07 DIAGNOSIS — M79661 Pain in right lower leg: Secondary | ICD-10-CM | POA: Diagnosis not present

## 2022-09-07 DIAGNOSIS — J918 Pleural effusion in other conditions classified elsewhere: Secondary | ICD-10-CM | POA: Diagnosis present

## 2022-09-07 DIAGNOSIS — I77810 Thoracic aortic ectasia: Secondary | ICD-10-CM | POA: Diagnosis present

## 2022-09-07 DIAGNOSIS — Z791 Long term (current) use of non-steroidal anti-inflammatories (NSAID): Secondary | ICD-10-CM | POA: Diagnosis not present

## 2022-09-07 DIAGNOSIS — Z825 Family history of asthma and other chronic lower respiratory diseases: Secondary | ICD-10-CM | POA: Diagnosis not present

## 2022-09-07 DIAGNOSIS — I10 Essential (primary) hypertension: Secondary | ICD-10-CM | POA: Diagnosis not present

## 2022-09-07 DIAGNOSIS — E8809 Other disorders of plasma-protein metabolism, not elsewhere classified: Secondary | ICD-10-CM | POA: Diagnosis not present

## 2022-09-07 DIAGNOSIS — Z87891 Personal history of nicotine dependence: Secondary | ICD-10-CM | POA: Diagnosis not present

## 2022-09-07 DIAGNOSIS — I110 Hypertensive heart disease with heart failure: Secondary | ICD-10-CM | POA: Diagnosis present

## 2022-09-07 DIAGNOSIS — E1165 Type 2 diabetes mellitus with hyperglycemia: Secondary | ICD-10-CM | POA: Diagnosis not present

## 2022-09-07 DIAGNOSIS — B029 Zoster without complications: Secondary | ICD-10-CM | POA: Diagnosis present

## 2022-09-07 DIAGNOSIS — J45909 Unspecified asthma, uncomplicated: Secondary | ICD-10-CM | POA: Diagnosis present

## 2022-09-07 LAB — ECHOCARDIOGRAM COMPLETE
AR max vel: 3.23 cm2
AV Peak grad: 13.1 mmHg
Ao pk vel: 1.81 m/s
Area-P 1/2: 4.86 cm2
Height: 65 in
MV M vel: 4.25 m/s
MV Peak grad: 72.3 mmHg
S' Lateral: 2.9 cm
Weight: 4275.16 oz

## 2022-09-07 LAB — COMPREHENSIVE METABOLIC PANEL
ALT: 28 U/L (ref 0–44)
AST: 26 U/L (ref 15–41)
Albumin: 2.7 g/dL — ABNORMAL LOW (ref 3.5–5.0)
Alkaline Phosphatase: 60 U/L (ref 38–126)
Anion gap: 7 (ref 5–15)
BUN: 23 mg/dL (ref 8–23)
CO2: 34 mmol/L — ABNORMAL HIGH (ref 22–32)
Calcium: 7.9 mg/dL — ABNORMAL LOW (ref 8.9–10.3)
Chloride: 99 mmol/L (ref 98–111)
Creatinine, Ser: 1.25 mg/dL — ABNORMAL HIGH (ref 0.44–1.00)
GFR, Estimated: 46 mL/min — ABNORMAL LOW (ref >60)
Glucose, Bld: 161 mg/dL — ABNORMAL HIGH (ref 70–99)
Potassium: 3.2 mmol/L — ABNORMAL LOW (ref 3.5–5.1)
Sodium: 140 mmol/L (ref 135–145)
Total Bilirubin: 0.7 mg/dL (ref 0.3–1.2)
Total Protein: 6.8 g/dL (ref 6.5–8.1)

## 2022-09-07 LAB — RESPIRATORY PANEL BY PCR

## 2022-09-07 LAB — CBC WITH DIFFERENTIAL/PLATELET
Abs Immature Granulocytes: 0.06 10*3/uL (ref 0.00–0.07)
Basophils Absolute: 0.1 10*3/uL (ref 0.0–0.1)
Basophils Relative: 0 %
Eosinophils Absolute: 0.3 10*3/uL (ref 0.0–0.5)
Eosinophils Relative: 3 %
HCT: 38.4 % (ref 36.0–46.0)
Hemoglobin: 12.1 g/dL (ref 12.0–15.0)
Immature Granulocytes: 1 %
Lymphocytes Relative: 14 %
Lymphs Abs: 1.6 10*3/uL (ref 0.7–4.0)
MCH: 26.1 pg (ref 26.0–34.0)
MCHC: 31.5 g/dL (ref 30.0–36.0)
MCV: 82.9 fL (ref 80.0–100.0)
Monocytes Absolute: 0.9 10*3/uL (ref 0.1–1.0)
Monocytes Relative: 8 %
Neutro Abs: 8.3 10*3/uL — ABNORMAL HIGH (ref 1.7–7.7)
Neutrophils Relative %: 74 %
Platelets: 165 10*3/uL (ref 150–400)
RBC: 4.63 MIL/uL (ref 3.87–5.11)
RDW: 15.2 % (ref 11.5–15.5)
WBC: 11.2 10*3/uL — ABNORMAL HIGH (ref 4.0–10.5)
nRBC: 0 % (ref 0.0–0.2)

## 2022-09-07 LAB — HIV ANTIBODY (ROUTINE TESTING W REFLEX): HIV Screen 4th Generation wRfx: NONREACTIVE

## 2022-09-07 LAB — MAGNESIUM: Magnesium: 2.2 mg/dL (ref 1.7–2.4)

## 2022-09-07 LAB — PHOSPHORUS: Phosphorus: 3.9 mg/dL (ref 2.5–4.6)

## 2022-09-07 LAB — PROCALCITONIN: Procalcitonin: 0.27 ng/mL

## 2022-09-07 LAB — GLUCOSE, CAPILLARY: Glucose-Capillary: 196 mg/dL — ABNORMAL HIGH (ref 70–99)

## 2022-09-07 MED ORDER — ALBUTEROL SULFATE (2.5 MG/3ML) 0.083% IN NEBU: 2.5000 mg | INHALATION_SOLUTION | RESPIRATORY_TRACT | Status: DC | PRN

## 2022-09-07 MED ORDER — ACETAMINOPHEN 650 MG RE SUPP: 650.0000 mg | Freq: Four times a day (QID) | RECTAL | Status: DC | PRN

## 2022-09-07 MED ORDER — IPRATROPIUM-ALBUTEROL 0.5-2.5 (3) MG/3ML IN SOLN
3.0000 mL | Freq: Four times a day (QID) | RESPIRATORY_TRACT | Status: DC
  Administered 2022-09-07 – 2022-09-09 (×10): 3 mL via RESPIRATORY_TRACT
  Filled 2022-09-07 (×10): qty 3

## 2022-09-07 MED ORDER — SODIUM CHLORIDE 0.9 % IV SOLN
2.0000 g | INTRAVENOUS | Status: AC
  Administered 2022-09-07 – 2022-09-11 (×5): 2 g via INTRAVENOUS
  Filled 2022-09-07 (×5): qty 20

## 2022-09-07 MED ORDER — IRBESARTAN 75 MG PO TABS: 75.0000 mg | ORAL_TABLET | Freq: Every day | ORAL | Status: DC

## 2022-09-07 MED ORDER — SODIUM CHLORIDE 0.9 % IV SOLN
500.0000 mg | INTRAVENOUS | Status: AC
  Administered 2022-09-07 – 2022-09-11 (×5): 500 mg via INTRAVENOUS
  Filled 2022-09-07 (×6): qty 5

## 2022-09-07 MED ORDER — ACETAMINOPHEN 325 MG PO TABS
650.0000 mg | ORAL_TABLET | Freq: Four times a day (QID) | ORAL | Status: DC | PRN
  Administered 2022-09-09 – 2022-09-12 (×2): 650 mg via ORAL
  Filled 2022-09-07 (×2): qty 2

## 2022-09-07 MED ORDER — ENOXAPARIN SODIUM 40 MG/0.4ML IJ SOSY
40.0000 mg | PREFILLED_SYRINGE | Freq: Every day | INTRAMUSCULAR | Status: DC
  Administered 2022-09-07 – 2022-09-08 (×2): 40 mg via SUBCUTANEOUS
  Filled 2022-09-07 (×2): qty 0.4

## 2022-09-07 MED ORDER — MELATONIN 3 MG PO TABS: 3.0000 mg | ORAL_TABLET | Freq: Every evening | ORAL | Status: DC | PRN

## 2022-09-07 MED ORDER — SODIUM CHLORIDE 0.9 % IV SOLN: 1.0000 g | INTRAVENOUS | Status: DC

## 2022-09-07 MED ORDER — ONDANSETRON HCL 4 MG/2ML IJ SOLN: 4.0000 mg | Freq: Four times a day (QID) | INTRAMUSCULAR | Status: DC | PRN

## 2022-09-07 MED ORDER — SITAGLIPTIN PHOS-METFORMIN HCL 50-500 MG PO TABS: 1.0000 | ORAL_TABLET | Freq: Two times a day (BID) | ORAL | Status: DC

## 2022-09-07 NOTE — ED Notes (Signed)
[facility] at bedside to transport pt to [place] 

## 2022-09-07 NOTE — Plan of Care (Signed)
  Problem: Education: Goal: Knowledge of General Education information will improve Description Including pain rating scale, medication(s)/side effects and non-pharmacologic comfort measures Outcome: Progressing   

## 2022-09-07 NOTE — Subjective & Objective (Signed)
72 year old female with HTN and COPD who presents to [facility] with worsening shortness of breath over the past week with cough, fever, and chills. She is found to be febrile and mildly tachycardic and tachypneic. She was hypoxic on room air. WBCs 14,300, lactic acid normal, BNP and troponin normal, and respiratory virus panel negative. Chest x-ray demonstrates vascular congestion with small pleural effusions and atelectasis versus infiltrate. Accepted in transfer to medical telemetry.

## 2022-09-07 NOTE — Progress Notes (Signed)
(  Carryover admission to the Day Admitter; accepted by Dr.  Antionette Char as transfer from  [facility]  to a  med-tele bed for  CAP complicated by acute hypoxic respiratory distress. Please see Dr.  Francesco Runner transfer progress note for additional details).   I have placed some additional preliminary admit orders via the adult multi-morbid admission order set.   I have also placed an order for continuation of the azithromycin and Rocephin that was initiated at [facility].  Also ordered scheduled duo nebulizer treatments in addition to the existing order for prn albuterol nebulizer treatments.  I have added on procalcitonin level, incentive spirometry, and flutter valve.  Also ordered morning labs in the form of CMP, CBC, and serum magnesium and phosphorus levels.     Newton Pigg, DO Hospitalist

## 2022-09-07 NOTE — Progress Notes (Signed)
Echocardiogram 2D Echocardiogram has been performed.  Lucendia Herrlich 09/07/2022, 3:06 PM

## 2022-09-08 DIAGNOSIS — J9601 Acute respiratory failure with hypoxia: Secondary | ICD-10-CM | POA: Diagnosis not present

## 2022-09-08 LAB — COMPREHENSIVE METABOLIC PANEL
ALT: 32 U/L (ref 0–44)
AST: 26 U/L (ref 15–41)
Albumin: 2.6 g/dL — ABNORMAL LOW (ref 3.5–5.0)
Alkaline Phosphatase: 61 U/L (ref 38–126)
Anion gap: 10 (ref 5–15)
BUN: 15 mg/dL (ref 8–23)
CO2: 31 mmol/L (ref 22–32)
Calcium: 7.9 mg/dL — ABNORMAL LOW (ref 8.9–10.3)
Chloride: 100 mmol/L (ref 98–111)
Creatinine, Ser: 0.75 mg/dL (ref 0.44–1.00)
GFR, Estimated: >60 mL/min (ref >60)
Glucose, Bld: 171 mg/dL — ABNORMAL HIGH (ref 70–99)
Potassium: 3.7 mmol/L (ref 3.5–5.1)
Sodium: 141 mmol/L (ref 135–145)
Total Bilirubin: 0.7 mg/dL (ref 0.3–1.2)
Total Protein: 7.1 g/dL (ref 6.5–8.1)

## 2022-09-08 LAB — GLUCOSE, CAPILLARY
Glucose-Capillary: 183 mg/dL — ABNORMAL HIGH (ref 70–99)
Glucose-Capillary: 180 mg/dL — ABNORMAL HIGH (ref 70–99)
Glucose-Capillary: 171 mg/dL — ABNORMAL HIGH (ref 70–99)

## 2022-09-08 LAB — CBC
HCT: 39.3 % (ref 36.0–46.0)
Hemoglobin: 12.1 g/dL (ref 12.0–15.0)
MCH: 26.3 pg (ref 26.0–34.0)
MCHC: 30.8 g/dL (ref 30.0–36.0)
MCV: 85.4 fL (ref 80.0–100.0)
Platelets: 187 10*3/uL (ref 150–400)
RBC: 4.6 MIL/uL (ref 3.87–5.11)
RDW: 15.4 % (ref 11.5–15.5)
WBC: 10 10*3/uL (ref 4.0–10.5)
nRBC: 0 % (ref 0.0–0.2)

## 2022-09-08 LAB — MAGNESIUM: Magnesium: 2.2 mg/dL (ref 1.7–2.4)

## 2022-09-08 MED ORDER — POTASSIUM CHLORIDE CRYS ER 20 MEQ PO TBCR
40.0000 meq | EXTENDED_RELEASE_TABLET | Freq: Two times a day (BID) | ORAL | Status: DC
  Administered 2022-09-08 – 2022-09-10 (×5): 40 meq via ORAL
  Filled 2022-09-08 (×5): qty 2

## 2022-09-08 MED ORDER — ENOXAPARIN SODIUM 60 MG/0.6ML IJ SOSY
60.0000 mg | PREFILLED_SYRINGE | Freq: Every day | INTRAMUSCULAR | Status: DC
  Administered 2022-09-09 – 2022-09-12 (×4): 60 mg via SUBCUTANEOUS
  Filled 2022-09-08 (×4): qty 0.6

## 2022-09-08 NOTE — H&P (Signed)
History and Physical    Patient: [name] ZOX:[id number] DOB: [birth year] DOA: 09/06/2022 DOS: the patient was seen and examined on 09/08/2022 PCP: Alwyn Pea, MD  Patient coming from: Home  Chief Complaint:  Chief Complaint  Patient presents with   Shortness of Breath   HPI:  72 year old female with HTN and COPD who presents with worsening shortness of breath over the past week with cough, fever, and chills.  She is found to be febrile and mildly tachycardic and tachypneic.  She was hypoxic on room air.  WBCs 14,300, lactic acid normal, BNP and troponin normal, and respiratory virus panel negative.  Chest x-ray demonstrates vascular congestion with small pleural effusions and atelectasis versus infiltrate.   She reports no orthopnea, lower extremity, chest pain, palpitations, lightheadedness or dizziness.  She reports no sick contacts at home.  She reports her symptoms started a week after each of her shingles vaccine.  She does not wear oxygen at home.  She stopped smoking 30 years ago.  ED/inpatient course: Blood culture was collected and she was given Rocephin, azithromycin, nebulized breathing treatments, acetaminophen, and supplemental oxygen. Antibiotics were continued. TTE was obtained.   Review of Systems: As mentioned in the history of present illness. All other systems reviewed and are negative. Past Medical History:  Diagnosis Date   Asthma    Hypertension    Past Surgical History:  Procedure Laterality Date   ABDOMINAL HYSTERECTOMY     Social History:  reports that she has never smoked. She has never used smokeless tobacco. She reports that she does not drink alcohol. No history on file for drug use.  No Known Allergies  Family History  Problem Relation Age of Onset   Asthma Other    Cancer Other    Hypertension Other     Prior to Admission medications   Medication Sig Start Date End Date Taking? Authorizing Provider  albuterol (VENTOLIN HFA) 108 (90 Base)  MCG/ACT inhaler Inhale 2 puffs into the lungs every 4 (four) hours as needed for wheezing or shortness of breath. 04/19/22  Yes [provider]  atorvastatin (LIPITOR) 40 MG tablet Take 40 mg by mouth daily. 07/24/22  Yes [provider]  JANUVIA 100 MG tablet Take 100 mg by mouth daily. 08/09/22  Yes [provider]  JARDIANCE 25 MG TABS tablet Take 25 mg by mouth daily. 07/11/22  Yes [provider]  meloxicam (MOBIC) 15 MG tablet Take 15 mg by mouth daily. 08/30/22  Yes [provider]  Olmesartan-amLODIPine-HCTZ 40-10-12.5 MG TABS Take 1 tablet by mouth daily. 08/21/22  Yes [provider]  prednisoLONE acetate (PRED FORTE) 1 % ophthalmic suspension Place 1 drop into the right eye daily. 08/14/22  Yes [provider]  RYBELSUS 14 MG TABS Take 1 tablet by mouth daily. 08/24/22  Yes [provider]  Vitamin D, Ergocalciferol, (DRISDOL) 1.25 MG (50000 UNIT) CAPS capsule Take 50,000 Units by mouth every 7 (seven) days. 08/30/22  Yes [provider]  sitaGLIPtan-metformin (JANUMET) 50-500 MG per tablet Take 1 tablet by mouth 2 (two) times daily with a meal. Patient not taking: Reported on 09/07/2022    [provider]  valsartan (DIOVAN) 40 MG tablet Take 40 mg by mouth daily. Patient not taking: Reported on 09/07/2022    [provider]    Physical Exam: Vitals:   09/08/22 0622 09/08/22 0658 09/08/22 0758 09/08/22 0823  BP: (!) 157/96  (!) 146/82   Pulse: 88  87 88  Resp:  17  19 18   Temp: 99.5 F (37.5 C) 97.8 F (36.6 C) 98.7 F (37.1 C)   TempSrc: Oral Oral Oral   SpO2: (!) 89% 93% 93%   Weight:      Height:       Physical Exam Vitals and nursing note reviewed.  Constitutional:      Appearance: Normal appearance. She is not ill-appearing.  HENT:     Head: Normocephalic and atraumatic.     Mouth/Throat:     Mouth: Mucous membranes are moist.  Cardiovascular:     Rate and Rhythm: Normal rate and  regular rhythm.  Pulmonary:     Breath sounds: Examination of the right-lower field reveals decreased breath sounds. Examination of the left-lower field reveals decreased breath sounds. Decreased breath sounds present.  Abdominal:     General: Abdomen is flat. There is no distension.     Palpations: Abdomen is soft. There is no mass.     Tenderness: There is no abdominal tenderness.  Musculoskeletal:     Right lower leg: No edema.     Left lower leg: No edema.  Skin:    General: Skin is warm and dry.     Capillary Refill: Capillary refill takes less than 2 seconds.  Neurological:     Mental Status: She is alert.  Psychiatric:        Mood and Affect: Mood normal.        Behavior: Behavior normal.     Data Reviewed:     Latest Ref Rng & Units 09/08/2022    8:46 AM 09/07/2022    6:13 AM 09/06/2022    7:19 PM  CBC  WBC 4.0 - 10.5 K/uL 10.0  11.2  14.3   Hemoglobin 12.0 - 15.0 g/dL 40.9  81.1  91.4   Hematocrit 36.0 - 46.0 % 39.3  38.4  39.8   Platelets 150 - 400 K/uL 187  165  181       Latest Ref Rng & Units 09/07/2022    6:13 AM 09/06/2022    7:19 PM 12/01/2007    3:46 AM  CMP  Glucose 70 - 99 mg/dL 782  956  213   BUN 8 - 23 mg/dL 23  26  3    Creatinine 0.44 - 1.00 mg/dL 0.86  5.78  4.69   Sodium 135 - 145 mmol/L 140  140  137   Potassium 3.5 - 5.1 mmol/L 3.2  3.3  3.6   Chloride 98 - 111 mmol/L 99  97  99   CO2 22 - 32 mmol/L 34  35  31   Calcium 8.9 - 10.3 mg/dL 7.9  8.9  8.2   Total Protein 6.5 - 8.1 g/dL 6.8     Total Bilirubin 0.3 - 1.2 mg/dL 0.7     Alkaline Phos 38 - 126 U/L 60     AST 15 - 41 U/L 26     ALT 0 - 44 U/L 28        Assessment and Plan: 72 year old female with HTN and COPD who presents with worsening shortness of breath over the past week with cough, fever, and chills c/w CAP.  AHRF CAP Hx of COPD At home does not wear any oxygen.  She stopped smoking 30 years ago.  She reports last week start developing fever, cough, shortness of breath.   No sick contacts.  Here she is on 3 to 4 L of oxygen, elevated white count.  Started empiric treatment with  antibiotics with improvement in her white count and her symptoms.  She is still on 3 to 4 L of oxygen currently.  Procalcitonin mildly elevated 0.27.  Respiratory virus panel was negative.  Will continue Treatment for CAP. - Ceftriaxone and azithromycin started on 8/22- - Follow-up strep and Legionella urine antigen -Encourage use of flutter valve - Albuterol nebs every 6 hours  AKI Hypokalemia Creatinine elevated to 1.25 baseline 0.8. -Encourage p.o. intake - Potassium replacement  DM2 At home is on sitagliptin, semaglutide, and empagliflozin. -A1c ordered.  -Hold on sliding scale insulin and home meds for now, glucoses 160-200 -POCT glucose checks   Advance Care Planning:   Code Status: Full Code   Consults: None  Family Communication: none at bedside  Severity of Illness: The appropriate patient status for this patient is INPATIENT. Inpatient status is judged to be reasonable and necessary in order to provide the required intensity of service to ensure the patient's safety. The patient's presenting symptoms, physical exam findings, and initial radiographic and laboratory data in the context of their chronic comorbidities is felt to place them at high risk for further clinical deterioration. Furthermore, it is not anticipated that the patient will be medically stable for discharge from the hospital within 2 midnights of admission.   * I certify that at the point of admission it is my clinical judgment that the patient will require inpatient hospital care spanning beyond 2 midnights from the point of admission due to high intensity of service, high risk for further deterioration and high frequency of surveillance required.*  Author: Charolotte Eke, MD 09/08/2022 9:08 AM  For on call review [email or web address].

## 2022-09-08 NOTE — Plan of Care (Signed)
  Problem: Education: Goal: Knowledge of General Education information will improve Description: Including pain rating scale, medication(s)/side effects and non-pharmacologic comfort measures Outcome: Progressing   Problem: Clinical Measurements: Goal: Will remain free from infection Outcome: Progressing   Problem: Clinical Measurements: Goal: Diagnostic test results will improve Outcome: Progressing   Problem: Clinical Measurements: Goal: Respiratory complications will improve Outcome: Progressing   Problem: Activity: Goal: Risk for activity intolerance will decrease Outcome: Progressing   Problem: Pain Managment: Goal: General experience of comfort will improve Outcome: Progressing

## 2022-09-08 NOTE — Progress Notes (Signed)
F/u K- scheduled potassium with no diuretic use

## 2022-09-09 ENCOUNTER — Other Ambulatory Visit: Payer: Self-pay

## 2022-09-09 ENCOUNTER — Inpatient Hospital Stay (HOSPITAL_COMMUNITY): Payer: Medicare PPO

## 2022-09-09 DIAGNOSIS — J45909 Unspecified asthma, uncomplicated: Secondary | ICD-10-CM

## 2022-09-09 DIAGNOSIS — J189 Pneumonia, unspecified organism: Secondary | ICD-10-CM | POA: Diagnosis not present

## 2022-09-09 DIAGNOSIS — J9601 Acute respiratory failure with hypoxia: Secondary | ICD-10-CM

## 2022-09-09 DIAGNOSIS — I5031 Acute diastolic (congestive) heart failure: Secondary | ICD-10-CM

## 2022-09-09 DIAGNOSIS — I10 Essential (primary) hypertension: Secondary | ICD-10-CM | POA: Diagnosis not present

## 2022-09-09 LAB — COMPREHENSIVE METABOLIC PANEL
ALT: 29 U/L (ref 0–44)
AST: 23 U/L (ref 15–41)
Albumin: 2.6 g/dL — ABNORMAL LOW (ref 3.5–5.0)
Alkaline Phosphatase: 61 U/L (ref 38–126)
Anion gap: 14 (ref 5–15)
BUN: 14 mg/dL (ref 8–23)
CO2: 31 mmol/L (ref 22–32)
Calcium: 8.2 mg/dL — ABNORMAL LOW (ref 8.9–10.3)
Chloride: 98 mmol/L (ref 98–111)
Creatinine, Ser: 0.85 mg/dL (ref 0.44–1.00)
GFR, Estimated: >60 mL/min (ref >60)
Glucose, Bld: 185 mg/dL — ABNORMAL HIGH (ref 70–99)
Potassium: 4.5 mmol/L (ref 3.5–5.1)
Sodium: 143 mmol/L (ref 135–145)
Total Bilirubin: 0.5 mg/dL (ref 0.3–1.2)
Total Protein: 7 g/dL (ref 6.5–8.1)

## 2022-09-09 LAB — HEMOGLOBIN A1C
Hgb A1c MFr Bld: 8.4 % — ABNORMAL HIGH (ref 4.8–5.6)
Mean Plasma Glucose: 194.38 mg/dL

## 2022-09-09 LAB — LEGIONELLA PNEUMOPHILA SEROGP 1 UR AG: L. pneumophila Serogp 1 Ur Ag: NEGATIVE

## 2022-09-09 LAB — CBC
HCT: 39.4 % (ref 36.0–46.0)
Hemoglobin: 12.1 g/dL (ref 12.0–15.0)
MCH: 26.1 pg (ref 26.0–34.0)
MCHC: 30.7 g/dL (ref 30.0–36.0)
MCV: 85.1 fL (ref 80.0–100.0)
Platelets: 201 10*3/uL (ref 150–400)
RBC: 4.63 MIL/uL (ref 3.87–5.11)
RDW: 15.5 % (ref 11.5–15.5)
WBC: 10.3 10*3/uL (ref 4.0–10.5)
nRBC: 0 % (ref 0.0–0.2)

## 2022-09-09 LAB — PROCALCITONIN: Procalcitonin: 0.1 ng/mL

## 2022-09-09 LAB — GLUCOSE, CAPILLARY
Glucose-Capillary: 154 mg/dL — ABNORMAL HIGH (ref 70–99)
Glucose-Capillary: 168 mg/dL — ABNORMAL HIGH (ref 70–99)
Glucose-Capillary: 179 mg/dL — ABNORMAL HIGH (ref 70–99)
Glucose-Capillary: 183 mg/dL — ABNORMAL HIGH (ref 70–99)

## 2022-09-09 LAB — BRAIN NATRIURETIC PEPTIDE: B Natriuretic Peptide: 101.3 pg/mL — ABNORMAL HIGH (ref 0.0–100.0)

## 2022-09-09 MED ORDER — FUROSEMIDE 10 MG/ML IJ SOLN
40.0000 mg | Freq: Two times a day (BID) | INTRAMUSCULAR | Status: DC
  Administered 2022-09-09 – 2022-09-10 (×2): 40 mg via INTRAVENOUS
  Filled 2022-09-09 (×2): qty 4

## 2022-09-09 MED ORDER — PREDNISOLONE ACETATE 1 % OP SUSP
1.0000 [drp] | Freq: Every day | OPHTHALMIC | Status: DC
  Administered 2022-09-09 – 2022-09-12 (×4): 1 [drp] via OPHTHALMIC
  Filled 2022-09-09: qty 5

## 2022-09-09 MED ORDER — SALINE SPRAY 0.65 % NA SOLN: 1.0000 | NASAL | Status: DC | PRN

## 2022-09-09 MED ORDER — INSULIN ASPART 100 UNIT/ML IJ SOLN: 0.0000 [IU] | Freq: Every day | INTRAMUSCULAR | Status: DC

## 2022-09-09 MED ORDER — GUAIFENESIN ER 600 MG PO TB12
1200.0000 mg | ORAL_TABLET | Freq: Two times a day (BID) | ORAL | Status: DC
  Administered 2022-09-09 – 2022-09-12 (×7): 1200 mg via ORAL
  Filled 2022-09-09 (×7): qty 2

## 2022-09-09 MED ORDER — IPRATROPIUM-ALBUTEROL 0.5-2.5 (3) MG/3ML IN SOLN
3.0000 mL | Freq: Four times a day (QID) | RESPIRATORY_TRACT | Status: DC
  Administered 2022-09-09 – 2022-09-10 (×2): 3 mL via RESPIRATORY_TRACT
  Filled 2022-09-09 (×2): qty 3

## 2022-09-09 MED ORDER — INSULIN ASPART 100 UNIT/ML IJ SOLN
0.0000 [IU] | Freq: Three times a day (TID) | INTRAMUSCULAR | Status: DC
  Administered 2022-09-09: 2 [IU] via SUBCUTANEOUS
  Administered 2022-09-10: 3 [IU] via SUBCUTANEOUS

## 2022-09-09 NOTE — Progress Notes (Signed)
PROGRESS NOTE    [name]  OVF:[id number] DOB: 09-20-1950 DOA: 09/06/2022 PCP: Alwyn Pea, MD   Brief Narrative:  The patient is a morbidly obese African-American female with a past medical history significant for manometry hypertension, COPD as well as other comorbidities who presents with worsening shortness of breath over the last week with cough, fever and chills.  He was found be febrile and mildly tachycardic and tachypneic and was noted to be hypoxic on room air.  Her WBC was 14,300 and lactic acid was normal and BNP and troponin were normal and respiratory virus panel and COVID were negative.  Chest x-ray demonstrated vascular congestion with small pleural effusion atelectasis versus infiltrate.  She denies any orthopnea, lower extremity swelling, chest pain, palpitations, lightheadedness or dizziness.  She states that her symptoms started a week after each of her shingles vaccines and does not wear oxygen at home.  She states that she stopped smoking about 30 years ago.  Further workup included blood cultures and she was placed on antibiotics with Rocephin and azithromycin given nebulized breathing treatments.  She was placed on antibiotics and supplemental oxygen and a echocardiogram was obtained which showed diastolic dysfunction.  Given her lack of improvement we will start diuresis and if she continues to not improve we will check a CTA of the chest to evaluate for PE.  She will need an ambulatory home O2 screen prior to discharge and repeat chest x-ray in a.m.  Assessment and Plan:  Acute Respiratory Failure with Hypoxia in the setting of CAP and Concomitant Acute Diastolic CHF Exacerbation Hx of COPD -At home does not wear any oxygen.   -She stopped smoking 30 years ago.   -She reports last week start developing fever, cough, shortness of breath.  No sick contacts.   -Here she is on 3 to 4 L of oxygen, elevated white count.   -Started empiric treatment with antibiotics with  improvement in her white count and her symptoms.   -She is still on 3 to 4 L of oxygen currently.   -Check BNP and ECHO; Initial BNP was 53.0 and will repeat -ECHO done and showed "Left ventricular ejection fraction, by estimation, is 55 to 60%. The left ventricle has normal function. The left ventricle has no regional wall motion abnormalities. Left ventricular diastolic parameters are consistent with Grade I diastolic dysfunction (impaired relaxation). Right ventricular systolic function is normal. The right ventricular size is normal. There is mildly elevated pulmonary artery systolic pressure. The estimated right ventricular systolic pressure is 43.0 mmHg. Right atrial size was mildly dilated. The mitral valve is normal in structure. Trivial mitral valve regurgitation. No evidence of mitral stenosis. The aortic valve is tricuspid. Aortic valve regurgitation is not visualized. No aortic stenosis is present. Aortic dilatation noted. There is mild dilatation of the ascending aorta, measuring 38 mm. The inferior vena cava is dilated in size with >50% respiratory variability, suggesting right atrial pressure of 8 mmHg."  SpO2: 92 % O2 Flow Rate (L/min): 4 L/min FiO2 (%): 36 % -Procalcitonin mildly elevated 0.27 and is now 0.10.   -Will trial IV Lasix 40 mg BID  -Strict I's and O's and Daily Weights;  Intake/Output Summary (Last 24 hours) at 09/09/2022 1404 Last data filed at 09/09/2022 0455 Gross per 24 hour  Intake 350.06 ml  Output 600 ml  Net -249.94 ml    Filed Weights   09/07/22 0155 09/08/22 0500  Weight: 121.2 kg 120 kg  -Respiratory virus panel was negative.  Will continue Treatment for CAP. -Repeat CXR done and showed "Progressive diffuse interstitial and airspace opacities, right greater than left. Findings are concerning for congestive heart failure. Infection is not excluded." -WBC Trend: Recent Labs  Lab 09/06/22 1919 09/07/22 0613 09/08/22 0846 09/09/22 0420  WBC 14.3* 11.2*  10.0 10.3  -C/w IV Ceftriaxone and azithromycin started on 8/22- -Follow-up strep and Legionella urine antigen -Flutter Valve, Incentive Spirometry, and Guaifenesin 1200 mg po BID -Continuous pulse oximetry maintain O2 saturations greater than 90% -Continue supplemental oxygen via nasal cannula wean O2 as tolerated -She will need an amatory home O2 screen prior to discharge and a repeat chest x-ray in the a.m. -If continues to be dyspneic and remains on supplemental oxygen despite being diuresed we will obtain a CTA of the chest to evaluate for PE  AKI -BUN/Cr Trend: Recent Labs  Lab 09/06/22 1919 09/07/22 0613 09/08/22 0846 09/09/22 0420  BUN 26* 23 15 14   CREATININE 0.95 1.25* 0.75 0.85  -IVF now stopped and will give Diuresis  -Avoid Nephrotoxic Medications, Contrast Dyes, Hypotension and Dehydration to Ensure Adequate Renal Perfusion and will need to Renally Adjust Meds -Continue to Monitor and Trend Renal Function carefully and repeat CMP in the AM   Hypokalemia -Patient's K+ Level Trend: Recent Labs  Lab 09/06/22 1919 09/07/22 0613 09/08/22 0846 09/09/22 0420  K 3.3* 3.2* 3.7 4.5  -Continue to Monitor and Replete as Necessary -Repeat CMP in the AM   Diabetes Mellitus Type 2 -HbA1c was 8.4 -At Home she is on Sitagliptin-Metformin 50-500 mg po 1 tab BID and Empagliflozin 25 mg po Daily -Check HbA1c and Continue to Monitor CBG's per protocol -CBG Trend: Recent Labs  Lab 09/07/22 2142 09/08/22 1158 09/08/22 1653 09/08/22 2010 09/09/22 0729 09/09/22 1149  GLUCAP 196* 183* 180* 171* 154* 168*   Recent Labs  Lab 09/06/22 1919 09/07/22 0613 09/08/22 0846 09/09/22 0420  GLUCOSE 208* 161* 171* 185*  -Will place on Sensitive Novolog SSI AC/HS  Hypoalbuminemia -Patient's Albumin Trend: Recent Labs  Lab 09/07/22 0613 09/08/22 0846 09/09/22 0420  ALBUMIN 2.7* 2.6* 2.6*  -Continue to Monitor and Trend and repeat CMP in the AM  Morbid Obesity -Complicates  overall prognosis and care -Estimated body mass index is 44.02 kg/m as calculated from the following:   Height as of this encounter: 5\' 5"  (1.651 m).   Weight as of this encounter: 120 kg.  -Weight Loss and Dietary Counseling given   DVT prophylaxis: SCDs Start: 09/07/22 0159    Code Status: Full Code Family Communication:  No family present at bedside  Disposition Plan:  Level of care: Telemetry Status is: Inpatient Remains inpatient appropriate because: Needs further clinical improvement and clearance and needs further workup given that she continues to be dyspneic on remains on oxygen   Consultants:  None  Procedures:  As delineated as above  ECHOCARDIOGRAM IMPRESSIONS     1. Left ventricular ejection fraction, by estimation, is 55 to 60%. The  left ventricle has normal function. The left ventricle has no regional  wall motion abnormalities. Left ventricular diastolic parameters are  consistent with Grade I diastolic  dysfunction (impaired relaxation).   2. Right ventricular systolic function is normal. The right ventricular  size is normal. There is mildly elevated pulmonary artery systolic  pressure. The estimated right ventricular systolic pressure is 43.0 mmHg.   3. Right atrial size was mildly dilated.   4. The mitral valve is normal in structure. Trivial mitral valve  regurgitation. No evidence  of mitral stenosis.   5. The aortic valve is tricuspid. Aortic valve regurgitation is not  visualized. No aortic stenosis is present.   6. Aortic dilatation noted. There is mild dilatation of the ascending  aorta, measuring 38 mm.   7. The inferior vena cava is dilated in size with >50% respiratory  variability, suggesting right atrial pressure of 8 mmHg.   FINDINGS   Left Ventricle: Left ventricular ejection fraction, by estimation, is 55  to 60%. The left ventricle has normal function. The left ventricle has no  regional wall motion abnormalities. The left ventricular  internal cavity  size was normal in size. There is   no left ventricular hypertrophy. Left ventricular diastolic parameters  are consistent with Grade I diastolic dysfunction (impaired relaxation).   Right Ventricle: The right ventricular size is normal. No increase in  right ventricular wall thickness. Right ventricular systolic function is  normal. There is mildly elevated pulmonary artery systolic pressure. The  tricuspid regurgitant velocity is 2.96   m/s, and with an assumed right atrial pressure of 8 mmHg, the estimated  right ventricular systolic pressure is 43.0 mmHg.   Left Atrium: Left atrial size was normal in size.   Right Atrium: Right atrial size was mildly dilated.   Pericardium: There is no evidence of pericardial effusion.   Mitral Valve: The mitral valve is normal in structure. Trivial mitral  valve regurgitation. No evidence of mitral valve stenosis.   Tricuspid Valve: The tricuspid valve is normal in structure. Tricuspid  valve regurgitation is trivial.   Aortic Valve: The aortic valve is tricuspid. Aortic valve regurgitation is  not visualized. No aortic stenosis is present. Aortic valve peak gradient  measures 13.1 mmHg.   Pulmonic Valve: The pulmonic valve was normal in structure. Pulmonic valve  regurgitation is not visualized.   Aorta: The aortic root is normal in size and structure and aortic  dilatation noted. There is mild dilatation of the ascending aorta,  measuring 38 mm.   Venous: The inferior vena cava is dilated in size with greater than 50%  respiratory variability, suggesting right atrial pressure of 8 mmHg.   IAS/Shunts: No atrial level shunt detected by color flow Doppler.     LEFT VENTRICLE  PLAX 2D  LVIDd:         4.50 cm   Diastology  LVIDs:         2.90 cm   LV e' medial:    14.30 cm/s  LV PW:         0.90 cm   LV E/e' medial:  6.0  LV IVS:        1.00 cm   LV e' lateral:   16.30 cm/s  LVOT diam:     2.20 cm   LV E/e' lateral:  5.2  LV SV:         112  LV SV Index:   50  LVOT Area:     3.80 cm    RIGHT VENTRICLE             IVC  RV S prime:     18.00 cm/s  IVC diam: 2.60 cm  TAPSE (M-mode): 2.6 cm   LEFT ATRIUM           Index        RIGHT ATRIUM           Index  LA diam:      3.80 cm 1.70 cm/m   RA Area:  20.30 cm  LA Vol (A2C): 47.3 ml 21.14 ml/m  RA Volume:   52.70 ml  23.56 ml/m  LA Vol (A4C): 51.7 ml 23.11 ml/m   AORTIC VALVE  AV Area (Vmax): 3.23 cm  AV Vmax:        181.00 cm/s  AV Peak Grad:   13.1 mmHg  LVOT Vmax:      154.00 cm/s  LVOT Vmean:     105.367 cm/s  LVOT VTI:       0.294 m    AORTA  Ao Root diam: 3.00 cm  Ao Asc diam:  3.80 cm   MITRAL VALVE                TRICUSPID VALVE  MV Area (PHT): 4.86 cm     TR Peak grad:   35.0 mmHg  MV Decel Time: 156 msec     TR Vmax:        296.00 cm/s  MR Peak grad: 72.2 mmHg  MR Vmax:      425.00 cm/s   SHUNTS  MV E velocity: 85.40 cm/s   Systemic VTI:  0.29 m  MV A velocity: 103.00 cm/s  Systemic Diam: 2.20 cm  MV E/A ratio:  0.83   Antimicrobials:  Anti-infectives (From admission, onward)    Start     Dose/Rate Route Frequency Ordered Stop   09/07/22 2200  azithromycin (ZITHROMAX) 500 mg in sodium chloride 0.9 % 250 mL IVPB        500 mg 250 mL/hr over 60 Minutes Intravenous Every 24 hours 09/07/22 0159     09/07/22 2200  cefTRIAXone (ROCEPHIN) 1 g in sodium chloride 0.9 % 100 mL IVPB  Status:  Discontinued        1 g 200 mL/hr over 30 Minutes Intravenous Every 24 hours 09/07/22 0159 09/07/22 0940   09/07/22 2200  cefTRIAXone (ROCEPHIN) 2 g in sodium chloride 0.9 % 100 mL IVPB        2 g 200 mL/hr over 30 Minutes Intravenous Every 24 hours 09/07/22 0940     09/06/22 2145  cefTRIAXone (ROCEPHIN) 1 g in sodium chloride 0.9 % 100 mL IVPB        1 g 200 mL/hr over 30 Minutes Intravenous  Once 09/06/22 2137 09/06/22 2239   09/06/22 2145  azithromycin (ZITHROMAX) 500 mg in sodium chloride 0.9 % 250 mL IVPB        500 mg 250 mL/hr  over 60 Minutes Intravenous  Once 09/06/22 2137 09/06/22 2340       Subjective: Seen and examined at bedside and she is still feeling short of breath and felt weak.  Does not think that she is improving much.  Denies abdominal discomfort or pain.  No other concerns or complaints at this time.  Objective: Vitals:   09/09/22 0446 09/09/22 0733 09/09/22 0820 09/09/22 1200  BP: (!) 159/94 (!) 184/108  (!) 144/85  Pulse: 90 94 90   Resp: 18 15 19    Temp: 99.1 F (37.3 C) 98.8 F (37.1 C)    TempSrc:  Oral    SpO2: 92% 92% 92%   Weight:      Height:        Intake/Output Summary (Last 24 hours) at 09/09/2022 1417 Last data filed at 09/09/2022 0455 Gross per 24 hour  Intake 350.06 ml  Output 600 ml  Net -249.94 ml   Filed Weights   09/07/22 0155 09/08/22 0500  Weight: 121.2 kg 120 kg  Examination: Physical Exam:  Constitutional: WN/WD morbidly obese African-American female who is on supplemental oxygen Respiratory: Diminished to auscultation bilaterally with some rhonchi and some crackles.  No appreciable wheezing or rales. Normal respiratory effort and patient is not tachypenic. No accessory muscle use.  Wearing supplemental oxygen via nasal cannula at 4 L Cardiovascular: RRR, no murmurs / rubs / gallops. S1 and S2 auscultated.  Mild 1+ lower extremity edema Abdomen: Soft, non-tender, distended secondary body habitus. Bowel sounds positive.  GU: Deferred. Musculoskeletal: No clubbing / cyanosis of digits/nails. No joint deformity upper and lower extremities. Skin: No rashes, lesions, ulcers limited skin evaluation. No induration; Warm and dry.  Neurologic: CN 2-12 grossly intact with no focal deficits. Romberg sign and cerebellar reflexes not assessed.  Psychiatric: Normal judgment and insight. Alert and oriented x 3. Normal mood and appropriate affect.   Data Reviewed: I have personally reviewed following labs and imaging studies  CBC: Recent Labs  Lab 09/06/22 1919  09/07/22 0613 09/08/22 0846 09/09/22 0420  WBC 14.3* 11.2* 10.0 10.3  NEUTROABS 11.8* 8.3*  --   --   HGB 12.8 12.1 12.1 12.1  HCT 39.8 38.4 39.3 39.4  MCV 80.7 82.9 85.4 85.1  PLT 181 165 187 201   Basic Metabolic Panel: Recent Labs  Lab 09/06/22 1919 09/07/22 0613 09/08/22 0846 09/09/22 0420  NA 140 140 141 143  K 3.3* 3.2* 3.7 4.5  CL 97* 99 100 98  CO2 35* 34* 31 31  GLUCOSE 208* 161* 171* 185*  BUN 26* 23 15 14   CREATININE 0.95 1.25* 0.75 0.85  CALCIUM 8.9 7.9* 7.9* 8.2*  MG  --  2.2 2.2  --   PHOS  --  3.9  --   --    GFR: Estimated Creatinine Clearance: 77.6 mL/min (by C-G formula based on SCr of 0.85 mg/dL). Liver Function Tests: Recent Labs  Lab 09/07/22 0613 09/08/22 0846 09/09/22 0420  AST 26 26 23   ALT 28 32 29  ALKPHOS 60 61 61  BILITOT 0.7 0.7 0.5  PROT 6.8 7.1 7.0  ALBUMIN 2.7* 2.6* 2.6*   No results for input(s): "LIPASE", "AMYLASE" in the last 168 hours. No results for input(s): "AMMONIA" in the last 168 hours. Coagulation Profile: No results for input(s): "INR", "PROTIME" in the last 168 hours. Cardiac Enzymes: No results for input(s): "CKTOTAL", "CKMB", "CKMBINDEX", "TROPONINI" in the last 168 hours. BNP (last 3 results) No results for input(s): "PROBNP" in the last 8760 hours. HbA1C: Recent Labs    09/09/22 0420  HGBA1C 8.4*   CBG: Recent Labs  Lab 09/08/22 1158 09/08/22 1653 09/08/22 2010 09/09/22 0729 09/09/22 1149  GLUCAP 183* 180* 171* 154* 168*   Lipid Profile: No results for input(s): "CHOL", "HDL", "LDLCALC", "TRIG", "CHOLHDL", "LDLDIRECT" in the last 72 hours. Thyroid Function Tests: No results for input(s): "TSH", "T4TOTAL", "FREET4", "T3FREE", "THYROIDAB" in the last 72 hours. Anemia Panel: No results for input(s): "VITAMINB12", "FOLATE", "FERRITIN", "TIBC", "IRON", "RETICCTPCT" in the last 72 hours. Sepsis Labs: Recent Labs  Lab 09/06/22 1926 09/07/22 0613 09/09/22 0420  PROCALCITON  --  0.27 0.10   LATICACIDVEN 1.0  --   --    Recent Results (from the past 240 hour(s))  Culture, blood (routine x 2)     Status: None (Preliminary result)   Collection Time: 09/06/22  6:56 PM   Specimen: BLOOD  Result Value Ref Range Status   Specimen Description BLOOD RIGHT ANTECUBITAL  Final   Special Requests   Final  BOTTLES DRAWN AEROBIC AND ANAEROBIC Blood Culture adequate volume   Culture   Final    NO GROWTH 3 DAYS Performed at [facility], [address], [place], [place] [address]    Report Status PENDING  Incomplete  Resp panel by RT-PCR (RSV, Flu A&B, Covid) Anterior Nasal Swab     Status: None   Collection Time: 09/06/22  7:26 PM   Specimen: Anterior Nasal Swab  Result Value Ref Range Status   SARS Coronavirus 2 by RT PCR NEGATIVE NEGATIVE Final    Comment: (NOTE) SARS-CoV-2 target nucleic acids are NOT DETECTED.  The SARS-CoV-2 RNA is generally detectable in upper respiratory specimens during the acute phase of infection. The lowest concentration of SARS-CoV-2 viral copies this assay can detect is 138 copies/mL. A negative result does not preclude SARS-Cov-2 infection and should not be used as the sole basis for treatment or other patient management decisions. A negative result may occur with  improper specimen collection/handling, submission of specimen other than nasopharyngeal swab, presence of viral mutation(s) within the areas targeted by this assay, and inadequate number of viral copies(<138 copies/mL). A negative result must be combined with clinical observations, patient history, and epidemiological information. The expected result is Negative.  Fact Sheet for Patients:  BloggerCourse.com  Fact Sheet for Healthcare Providers:  SeriousBroker.it  This test is no t yet approved or cleared by the [place] FDA and  has been authorized for detection and/or diagnosis of SARS-CoV-2 by FDA under an Emergency  Use Authorization (EUA). This EUA will remain  in effect (meaning this test can be used) for the duration of the COVID-19 declaration under Section 564(b)(1) of the Act, 21 U.S.C.section 360bbb-3(b)(1), unless the authorization is terminated  or revoked sooner.       Influenza A by PCR NEGATIVE NEGATIVE Final   Influenza B by PCR NEGATIVE NEGATIVE Final    Comment: (NOTE) The Xpert Xpress SARS-CoV-2/FLU/RSV plus assay is intended as an aid in the diagnosis of influenza from Nasopharyngeal swab specimens and should not be used as a sole basis for treatment. Nasal washings and aspirates are unacceptable for Xpert Xpress SARS-CoV-2/FLU/RSV testing.  Fact Sheet for Patients: BloggerCourse.com  Fact Sheet for Healthcare Providers: SeriousBroker.it  This test is not yet approved or cleared by the [place] FDA and has been authorized for detection and/or diagnosis of SARS-CoV-2 by FDA under an Emergency Use Authorization (EUA). This EUA will remain in effect (meaning this test can be used) for the duration of the COVID-19 declaration under Section 564(b)(1) of the Act, 21 U.S.C. section 360bbb-3(b)(1), unless the authorization is terminated or revoked.     Resp Syncytial Virus by PCR NEGATIVE NEGATIVE Final    Comment: (NOTE) Fact Sheet for Patients: BloggerCourse.com  Fact Sheet for Healthcare Providers: SeriousBroker.it  This test is not yet approved or cleared by the [place] FDA and has been authorized for detection and/or diagnosis of SARS-CoV-2 by FDA under an Emergency Use Authorization (EUA). This EUA will remain in effect (meaning this test can be used) for the duration of the COVID-19 declaration under Section 564(b)(1) of the Act, 21 U.S.C. section 360bbb-3(b)(1), unless the authorization is terminated or revoked.  Performed at [facility],  [address] [place], [place] [address]   Respiratory (~20 pathogens) panel by PCR     Status: None   Collection Time: 09/07/22  7:20 AM   Specimen: Nasopharyngeal Swab; Respiratory  Result Value Ref Range Status   Adenovirus NOT  DETECTED NOT DETECTED Final   Coronavirus 229E NOT DETECTED NOT DETECTED Final    Comment: (NOTE) The Coronavirus on the Respiratory Panel, DOES NOT test for the novel  Coronavirus (2019 nCoV)    Coronavirus HKU1 NOT DETECTED NOT DETECTED Final   Coronavirus NL63 NOT DETECTED NOT DETECTED Final   Coronavirus OC43 NOT DETECTED NOT DETECTED Final   Metapneumovirus NOT DETECTED NOT DETECTED Final   Rhinovirus / Enterovirus NOT DETECTED NOT DETECTED Final   Influenza A NOT DETECTED NOT DETECTED Final   Influenza B NOT DETECTED NOT DETECTED Final   Parainfluenza Virus 1 NOT DETECTED NOT DETECTED Final   Parainfluenza Virus 2 NOT DETECTED NOT DETECTED Final   Parainfluenza Virus 3 NOT DETECTED NOT DETECTED Final   Parainfluenza Virus 4 NOT DETECTED NOT DETECTED Final   Respiratory Syncytial Virus NOT DETECTED NOT DETECTED Final   Bordetella pertussis NOT DETECTED NOT DETECTED Final   Bordetella Parapertussis NOT DETECTED NOT DETECTED Final   Chlamydophila pneumoniae NOT DETECTED NOT DETECTED Final   Mycoplasma pneumoniae NOT DETECTED NOT DETECTED Final    Comment: Performed at [facility], [address] [place], [place] [address]    Radiology Studies: DG CHEST PORT 1 VIEW  Result Date: 09/09/2022 CLINICAL DATA:  Shortness of breath.  Weakness. EXAM: PORTABLE CHEST 1 VIEW COMPARISON:  One-view chest x-ray 09/06/2022 FINDINGS: The heart is enlarged. Progressive diffuse interstitial and airspace opacities are present, right greater than left. Moderate right and small left pleural effusions are noted. IMPRESSION: Progressive diffuse interstitial and airspace opacities, right greater than left. Findings are concerning for congestive heart failure.  Infection is not excluded. Electronically Signed   By: Marin Roberts M.D.   On: 09/09/2022 13:21   ECHOCARDIOGRAM COMPLETE  Result Date: 09/07/2022    ECHOCARDIOGRAM REPORT   Patient Name:   [name] Date of Exam: 09/07/2022 Medical Rec #:  [id number]    Height:       65.0 in Accession #:    [id number]   Weight:       267.2 lb Date of Birth:  11-09-1950    BSA:          2.237 m Patient Age:    72 years     BP:           128/72 mmHg Patient Gender: F            HR:           90 bpm. Exam Location:  Inpatient Procedure: 2D Echo, Cardiac Doppler and Color Doppler Indications:    CHF I50.9  History:        Patient has no prior history of Echocardiogram examinations.                 Risk Factors:Hypertension.  Sonographer:    Lucendia Herrlich Referring Phys: [id number] MICHAEL E NORINS IMPRESSIONS  1. Left ventricular ejection fraction, by estimation, is 55 to 60%. The left ventricle has normal function. The left ventricle has no regional wall motion abnormalities. Left ventricular diastolic parameters are consistent with Grade I diastolic dysfunction (impaired relaxation).  2. Right ventricular systolic function is normal. The right ventricular size is normal. There is mildly elevated pulmonary artery systolic pressure. The estimated right ventricular systolic pressure is 43.0 mmHg.  3. Right atrial size was mildly dilated.  4. The mitral valve is normal in structure. Trivial mitral valve regurgitation. No evidence of mitral stenosis.  5. The aortic valve is tricuspid. Aortic valve  regurgitation is not visualized. No aortic stenosis is present.  6. Aortic dilatation noted. There is mild dilatation of the ascending aorta, measuring 38 mm.  7. The inferior vena cava is dilated in size with >50% respiratory variability, suggesting right atrial pressure of 8 mmHg. FINDINGS  Left Ventricle: Left ventricular ejection fraction, by estimation, is 55 to 60%. The left ventricle has normal function. The left ventricle has  no regional wall motion abnormalities. The left ventricular internal cavity size was normal in size. There is  no left ventricular hypertrophy. Left ventricular diastolic parameters are consistent with Grade I diastolic dysfunction (impaired relaxation). Right Ventricle: The right ventricular size is normal. No increase in right ventricular wall thickness. Right ventricular systolic function is normal. There is mildly elevated pulmonary artery systolic pressure. The tricuspid regurgitant velocity is 2.96  m/s, and with an assumed right atrial pressure of 8 mmHg, the estimated right ventricular systolic pressure is 43.0 mmHg. Left Atrium: Left atrial size was normal in size. Right Atrium: Right atrial size was mildly dilated. Pericardium: There is no evidence of pericardial effusion. Mitral Valve: The mitral valve is normal in structure. Trivial mitral valve regurgitation. No evidence of mitral valve stenosis. Tricuspid Valve: The tricuspid valve is normal in structure. Tricuspid valve regurgitation is trivial. Aortic Valve: The aortic valve is tricuspid. Aortic valve regurgitation is not visualized. No aortic stenosis is present. Aortic valve peak gradient measures 13.1 mmHg. Pulmonic Valve: The pulmonic valve was normal in structure. Pulmonic valve regurgitation is not visualized. Aorta: The aortic root is normal in size and structure and aortic dilatation noted. There is mild dilatation of the ascending aorta, measuring 38 mm. Venous: The inferior vena cava is dilated in size with greater than 50% respiratory variability, suggesting right atrial pressure of 8 mmHg. IAS/Shunts: No atrial level shunt detected by color flow Doppler.  LEFT VENTRICLE PLAX 2D LVIDd:         4.50 cm   Diastology LVIDs:         2.90 cm   LV e' medial:    14.30 cm/s LV PW:         0.90 cm   LV E/e' medial:  6.0 LV IVS:        1.00 cm   LV e' lateral:   16.30 cm/s LVOT diam:     2.20 cm   LV E/e' lateral: 5.2 LV SV:         112 LV SV Index:    50 LVOT Area:     3.80 cm  RIGHT VENTRICLE             IVC RV S prime:     18.00 cm/s  IVC diam: 2.60 cm TAPSE (M-mode): 2.6 cm LEFT ATRIUM           Index        RIGHT ATRIUM           Index LA diam:      3.80 cm 1.70 cm/m   RA Area:     20.30 cm LA Vol (A2C): 47.3 ml 21.14 ml/m  RA Volume:   52.70 ml  23.56 ml/m LA Vol (A4C): 51.7 ml 23.11 ml/m  AORTIC VALVE AV Area (Vmax): 3.23 cm AV Vmax:        181.00 cm/s AV Peak Grad:   13.1 mmHg LVOT Vmax:      154.00 cm/s LVOT Vmean:     105.367 cm/s LVOT VTI:       0.294  m  AORTA Ao Root diam: 3.00 cm Ao Asc diam:  3.80 cm MITRAL VALVE                TRICUSPID VALVE MV Area (PHT): 4.86 cm     TR Peak grad:   35.0 mmHg MV Decel Time: 156 msec     TR Vmax:        296.00 cm/s MR Peak grad: 72.2 mmHg MR Vmax:      425.00 cm/s   SHUNTS MV E velocity: 85.40 cm/s   Systemic VTI:  0.29 m MV A velocity: 103.00 cm/s  Systemic Diam: 2.20 cm MV E/A ratio:  0.83 Dalton McleanMD Electronically signed by Wilfred Lacy Signature Date/Time: 09/07/2022/3:24:55 PM    Final     Scheduled Meds:  enoxaparin (LOVENOX) injection  60 mg Subcutaneous Daily   furosemide  40 mg Intravenous BID   guaiFENesin  1,200 mg Oral BID   insulin aspart  0-5 Units Subcutaneous QHS   insulin aspart  0-9 Units Subcutaneous TID WC   ipratropium-albuterol  3 mL Nebulization Q6H   potassium chloride  40 mEq Oral BID   prednisoLONE acetate  1 drop Right Eye Daily   Continuous Infusions:  azithromycin 500 mg (09/08/22 2218)   cefTRIAXone (ROCEPHIN)  IV 2 g (09/08/22 2113)    LOS: 2 days   Marguerita Merles, DO [facility] Available via Epic secure chat 7am-7pm After these hours, please refer to coverage provider listed on [email or web address] 09/09/2022, 2:17 PM

## 2022-09-09 NOTE — Hospital Course (Addendum)
The patient is a morbidly obese African-American female with a past medical history significant for manometry hypertension, COPD as well as other comorbidities who presents with worsening shortness of breath over the last week with cough, fever and chills.  He was found be febrile and mildly tachycardic and tachypneic and was noted to be hypoxic on room air.  Her WBC was 14,300 and lactic acid was normal and BNP and troponin were normal and respiratory virus panel and COVID were negative.  Chest x-ray demonstrated vascular congestion with small pleural effusion atelectasis versus infiltrate.  She denies any orthopnea, lower extremity swelling, chest pain, palpitations, lightheadedness or dizziness.  She states that her symptoms started a week after each of her shingles vaccines and does not wear oxygen at home.  She states that she stopped smoking about 30 years ago.  Further workup included blood cultures and she was placed on antibiotics with Rocephin and azithromycin given nebulized breathing treatments.  She was placed on antibiotics and supplemental oxygen and a echocardiogram was obtained which showed diastolic dysfunction.  Given her lack of improvement we will start diuresis and if she continues to not improve we will check a CTA of the chest to evaluate for PE.  She will need an ambulatory home O2 screen prior to discharge and repeat chest x-ray in a.m.  Assessment and Plan:  Acute Respiratory Failure with Hypoxia in the setting of CAP and Concomitant Acute Diastolic CHF Exacerbation Hx of COPD -At home does not wear any oxygen.   -She stopped smoking 30 years ago.   -She reports last week start developing fever, cough, shortness of breath.  No sick contacts.   -Here she is on 3 to 4 L of oxygen, elevated white count.   -Started empiric treatment with antibiotics with improvement in her white count and her symptoms.   -She is still on 3 to 4 L of oxygen currently.   -Check BNP and ECHO; Initial  BNP was 53.0 and will repeat -ECHO done and showed "Left ventricular ejection fraction, by estimation, is 55 to 60%. The left ventricle has normal function. The left ventricle has no regional wall motion abnormalities. Left ventricular diastolic parameters are consistent with Grade I diastolic dysfunction (impaired relaxation). Right ventricular systolic function is normal. The right ventricular size is normal. There is mildly elevated pulmonary artery systolic pressure. The estimated right ventricular systolic pressure is 43.0 mmHg. Right atrial size was mildly dilated. The mitral valve is normal in structure. Trivial mitral valve regurgitation. No evidence of mitral stenosis. The aortic valve is tricuspid. Aortic valve regurgitation is not visualized. No aortic stenosis is present. Aortic dilatation noted. There is mild dilatation of the ascending aorta, measuring 38 mm. The inferior vena cava is dilated in size with >50% respiratory variability, suggesting right atrial pressure of 8 mmHg."  SpO2: 92 % O2 Flow Rate (L/min): 4 L/min FiO2 (%): 36 % -Procalcitonin mildly elevated 0.27 and is now 0.10.   -Will trial IV Lasix 40 mg BID  -Strict I's and O's and Daily Weights;  Intake/Output Summary (Last 24 hours) at 09/09/2022 1404 Last data filed at 09/09/2022 0455 Gross per 24 hour  Intake 350.06 ml  Output 600 ml  Net -249.94 ml    Filed Weights   09/07/22 0155 09/08/22 0500  Weight: 121.2 kg 120 kg  -Respiratory virus panel was negative.  Will continue Treatment for CAP. -Repeat CXR done and showed "Progressive diffuse interstitial and airspace opacities, right greater than left. Findings are  concerning for congestive heart failure. Infection is not excluded." -WBC Trend: Recent Labs  Lab 09/06/22 1919 09/07/22 0613 09/08/22 0846 09/09/22 0420  WBC 14.3* 11.2* 10.0 10.3  -C/w IV Ceftriaxone and azithromycin started on 8/22- -Follow-up strep and Legionella urine antigen -Flutter Valve,  Incentive Spirometry, and Guaifenesin 1200 mg po BID -Continuous pulse oximetry maintain O2 saturations greater than 90% -Continue supplemental oxygen via nasal cannula wean O2 as tolerated -She will need an amatory home O2 screen prior to discharge and a repeat chest x-ray in the a.m. -If continues to be dyspneic and remains on supplemental oxygen despite being diuresed we will obtain a CTA of the chest to evaluate for PE  AKI -BUN/Cr Trend: Recent Labs  Lab 09/06/22 1919 09/07/22 0613 09/08/22 0846 09/09/22 0420  BUN 26* 23 15 14   CREATININE 0.95 1.25* 0.75 0.85  -IVF now stopped and will give Diuresis  -Avoid Nephrotoxic Medications, Contrast Dyes, Hypotension and Dehydration to Ensure Adequate Renal Perfusion and will need to Renally Adjust Meds -Continue to Monitor and Trend Renal Function carefully and repeat CMP in the AM   Hypokalemia -Patient's K+ Level Trend: Recent Labs  Lab 09/06/22 1919 09/07/22 0613 09/08/22 0846 09/09/22 0420  K 3.3* 3.2* 3.7 4.5  -Continue to Monitor and Replete as Necessary -Repeat CMP in the AM   Diabetes Mellitus Type 2 -HbA1c was 8.4 -At Home she is on Sitagliptin-Metformin 50-500 mg po 1 tab BID and Empagliflozin 25 mg po Daily -Check HbA1c and Continue to Monitor CBG's per protocol -CBG Trend: Recent Labs  Lab 09/07/22 2142 09/08/22 1158 09/08/22 1653 09/08/22 2010 09/09/22 0729 09/09/22 1149  GLUCAP 196* 183* 180* 171* 154* 168*   Recent Labs  Lab 09/06/22 1919 09/07/22 0613 09/08/22 0846 09/09/22 0420  GLUCOSE 208* 161* 171* 185*  -Will place on Sensitive Novolog SSI AC/HS  Hypoalbuminemia -Patient's Albumin Trend: Recent Labs  Lab 09/07/22 0613 09/08/22 0846 09/09/22 0420  ALBUMIN 2.7* 2.6* 2.6*  -Continue to Monitor and Trend and repeat CMP in the AM  Morbid Obesity -Complicates overall prognosis and care -Estimated body mass index is 44.02 kg/m as calculated from the following:   Height as of this  encounter: 5\' 5"  (1.651 m).   Weight as of this encounter: 120 kg.  -Weight Loss and Dietary Counseling given

## 2022-09-09 NOTE — Plan of Care (Signed)
A/ox4. Up with standby assist. Started on 3L nasal cannula but increased back to 4L by RT. Remains on IV antibiotics. No overnight issues.    Problem: Education: Goal: Knowledge of General Education information will improve Description: Including pain rating scale, medication(s)/side effects and non-pharmacologic comfort measures Outcome: Progressing   Problem: Health Behavior/Discharge Planning: Goal: Ability to manage health-related needs will improve Outcome: Progressing   Problem: Clinical Measurements: Goal: Ability to maintain clinical measurements within normal limits will improve Outcome: Progressing Goal: Will remain free from infection Outcome: Progressing Goal: Diagnostic test results will improve Outcome: Progressing Goal: Respiratory complications will improve Outcome: Progressing Goal: Cardiovascular complication will be avoided Outcome: Progressing   Problem: Activity: Goal: Risk for activity intolerance will decrease Outcome: Progressing   Problem: Nutrition: Goal: Adequate nutrition will be maintained Outcome: Progressing   Problem: Coping: Goal: Level of anxiety will decrease Outcome: Progressing   Problem: Elimination: Goal: Will not experience complications related to bowel motility Outcome: Progressing Goal: Will not experience complications related to urinary retention Outcome: Progressing   Problem: Pain Managment: Goal: General experience of comfort will improve Outcome: Progressing   Problem: Safety: Goal: Ability to remain free from injury will improve Outcome: Progressing   Problem: Skin Integrity: Goal: Risk for impaired skin integrity will decrease Outcome: Progressing

## 2022-09-10 ENCOUNTER — Inpatient Hospital Stay (HOSPITAL_COMMUNITY): Payer: Medicare PPO

## 2022-09-10 DIAGNOSIS — J9601 Acute respiratory failure with hypoxia: Secondary | ICD-10-CM | POA: Diagnosis not present

## 2022-09-10 LAB — COMPREHENSIVE METABOLIC PANEL
ALT: 24 U/L (ref 0–44)
AST: 15 U/L (ref 15–41)
Albumin: 2.6 g/dL — ABNORMAL LOW (ref 3.5–5.0)
Alkaline Phosphatase: 59 U/L (ref 38–126)
Anion gap: 8 (ref 5–15)
BUN: 10 mg/dL (ref 8–23)
CO2: 32 mmol/L (ref 22–32)
Calcium: 8.3 mg/dL — ABNORMAL LOW (ref 8.9–10.3)
Chloride: 100 mmol/L (ref 98–111)
Creatinine, Ser: 0.65 mg/dL (ref 0.44–1.00)
GFR, Estimated: >60 mL/min (ref >60)
Glucose, Bld: 175 mg/dL — ABNORMAL HIGH (ref 70–99)
Potassium: 4.4 mmol/L (ref 3.5–5.1)
Sodium: 140 mmol/L (ref 135–145)
Total Bilirubin: 0.6 mg/dL (ref 0.3–1.2)
Total Protein: 6.8 g/dL (ref 6.5–8.1)

## 2022-09-10 LAB — MAGNESIUM: Magnesium: 2 mg/dL (ref 1.7–2.4)

## 2022-09-10 LAB — CBC WITH DIFFERENTIAL/PLATELET
Abs Immature Granulocytes: 0.1 10*3/uL — ABNORMAL HIGH (ref 0.00–0.07)
Basophils Absolute: 0 10*3/uL (ref 0.0–0.1)
Basophils Relative: 0 %
Eosinophils Absolute: 0.4 10*3/uL (ref 0.0–0.5)
Eosinophils Relative: 4 %
HCT: 38.5 % (ref 36.0–46.0)
Hemoglobin: 11.5 g/dL — ABNORMAL LOW (ref 12.0–15.0)
Immature Granulocytes: 1 %
Lymphocytes Relative: 19 %
Lymphs Abs: 2 10*3/uL (ref 0.7–4.0)
MCH: 25.2 pg — ABNORMAL LOW (ref 26.0–34.0)
MCHC: 29.9 g/dL — ABNORMAL LOW (ref 30.0–36.0)
MCV: 84.2 fL (ref 80.0–100.0)
Monocytes Absolute: 1 10*3/uL (ref 0.1–1.0)
Monocytes Relative: 9 %
Neutro Abs: 6.8 10*3/uL (ref 1.7–7.7)
Neutrophils Relative %: 67 %
Platelets: 219 10*3/uL (ref 150–400)
RBC: 4.57 MIL/uL (ref 3.87–5.11)
RDW: 15.2 % (ref 11.5–15.5)
WBC: 10.3 10*3/uL (ref 4.0–10.5)
nRBC: 0 % (ref 0.0–0.2)

## 2022-09-10 LAB — GLUCOSE, CAPILLARY
Glucose-Capillary: 225 mg/dL — ABNORMAL HIGH (ref 70–99)
Glucose-Capillary: 166 mg/dL — ABNORMAL HIGH (ref 70–99)
Glucose-Capillary: 264 mg/dL — ABNORMAL HIGH (ref 70–99)

## 2022-09-10 LAB — PHOSPHORUS: Phosphorus: 2.5 mg/dL (ref 2.5–4.6)

## 2022-09-10 MED ORDER — PREDNISONE 20 MG PO TABS
30.0000 mg | ORAL_TABLET | Freq: Every day | ORAL | Status: DC
  Administered 2022-09-10 – 2022-09-12 (×3): 30 mg via ORAL
  Filled 2022-09-10 (×3): qty 1

## 2022-09-10 MED ORDER — BUDESONIDE 0.25 MG/2ML IN SUSP
0.2500 mg | Freq: Two times a day (BID) | RESPIRATORY_TRACT | Status: DC
  Administered 2022-09-10 – 2022-09-12 (×4): 0.25 mg via RESPIRATORY_TRACT
  Filled 2022-09-10 (×4): qty 2

## 2022-09-10 MED ORDER — INSULIN ASPART 100 UNIT/ML IJ SOLN
0.0000 [IU] | Freq: Every day | INTRAMUSCULAR | Status: DC
  Administered 2022-09-10: 3 [IU] via SUBCUTANEOUS

## 2022-09-10 MED ORDER — DICLOFENAC SODIUM 1 % EX GEL
4.0000 g | Freq: Four times a day (QID) | CUTANEOUS | Status: DC
  Administered 2022-09-10 – 2022-09-12 (×8): 4 g via TOPICAL
  Filled 2022-09-10: qty 100

## 2022-09-10 MED ORDER — IPRATROPIUM-ALBUTEROL 0.5-2.5 (3) MG/3ML IN SOLN
3.0000 mL | Freq: Three times a day (TID) | RESPIRATORY_TRACT | Status: DC
  Administered 2022-09-10: 3 mL via RESPIRATORY_TRACT
  Filled 2022-09-10: qty 3

## 2022-09-10 MED ORDER — ARFORMOTEROL TARTRATE 15 MCG/2ML IN NEBU
15.0000 ug | INHALATION_SOLUTION | Freq: Two times a day (BID) | RESPIRATORY_TRACT | Status: DC
  Administered 2022-09-10 – 2022-09-12 (×4): 15 ug via RESPIRATORY_TRACT
  Filled 2022-09-10 (×4): qty 2

## 2022-09-10 MED ORDER — INSULIN ASPART 100 UNIT/ML IJ SOLN
0.0000 [IU] | Freq: Three times a day (TID) | INTRAMUSCULAR | Status: DC
  Administered 2022-09-11: 3 [IU] via SUBCUTANEOUS
  Administered 2022-09-11: 5 [IU] via SUBCUTANEOUS
  Administered 2022-09-11: 8 [IU] via SUBCUTANEOUS
  Administered 2022-09-12: 3 [IU] via SUBCUTANEOUS
  Administered 2022-09-12: 8 [IU] via SUBCUTANEOUS
  Administered 2022-09-12: 3 [IU] via SUBCUTANEOUS

## 2022-09-10 NOTE — Plan of Care (Signed)

## 2022-09-10 NOTE — Progress Notes (Signed)
Pt laying in bed - O2 saturation 92% on 1.5L O2. Pt ambulated without O2 and sats dropped to 70% - pt became dizzy. 2L O2 administered during ambulation - patients sats came up to 88%. Patient was able to catch her breath and felt at her baseline. She sat up in the chair, took some deep breaths and O2 returned to 93%.

## 2022-09-10 NOTE — Progress Notes (Addendum)
PROGRESS NOTE   [name]  UXL:[id number]    DOB: 1950/03/23    DOA: 09/06/2022  PCP: Alwyn Pea, MD   I have briefly reviewed patients previous medical records in [facility].  Chief Complaint  Patient presents with   Shortness of Breath    Brief Narrative:  72 year old female, retired Runner, broadcasting/film/video, works Armed forces operational officer, lives with her daughter, independent, PMH of asthma/?  COPD, HTN, former smoker, presented to the ED on 8/22 with complaints of worsening dyspnea with cough, fever and chills.  NAD, febrile to 101 F, transiently tachycardic, occasional tachypnea, hypoxia with oxygen saturation of 66% on room air requiring 3 L/min [place] oxygen.  She reports that her symptoms started a week after getting shingles vaccine.  Admitted for acute respiratory failure with hypoxia due to pneumonia and COPD.   Assessment & Plan:  Principal Problem:   Pneumonia Active Problems:   Asthma, chronic   HTN (hypertension)   Acute respiratory failure with hypoxia Acute bronchitis with asthma versus COPD flare Suspected pneumonia, bacterial versus?  Viral History as noted above Not on home oxygen Hypoxic to 66% on room air in ED requiring 3 L/min [place] oxygen to bring oxygen saturation up to 92% BNP 53 > 101, HS Troponin 11, lactate 1, procalcitonin 0.27 > 0.1, WBC 14.3, influenza A, B, RSV, SARS coronavirus by R2 PCR negative, respiratory panel by PCR negative.  HIV screen negative.  Urine Legionella antigen negative. Chest x-ray 8/22: Enlarged cardiopericardial silhouette with vascular congestion.   Small pleural effusions with adjacent opacity, right-greater-than-left. Atelectasis versus infiltrate.  IV ceftriaxone and azithromycin, day 4/5.  Chest x-ray 8/25: Small bilateral pleural effusions, associated lower lobe opacities, favoring pneumonia over interstitial edema although mildly improved.   Clinically does not appear to be volume overloaded, -3.1 L thus far, DC IV Lasix Low index of suspicion  for PE Incentive spirometry, flutter valve, trial of low-dose prednisone 30 Mg daily, added Brovana and Pulmicort nebs Recommend repeating chest x-ray in 4 weeks to ensure resolution of abnormal findings Consider pulmonology consultation outpatient for formal PFTs.  ?  Acute diastolic CHF TTE: LVEF 55-60% grade 1 diastolic dysfunction S/p IV Lasix 40 mg x 2 doses -3.1 L thus far Clinically appears euvolemic, Lasix discontinued Could consider transitioning to oral Lasix tomorrow  AKI Resolved  Hypokalemia Replaced   DM2 uncontrolled with hyperglycemia Hemoglobin A1c 8.4, poorly controlled, will need outpatient follow-up for DM med adjustments At home is on sitagliptin, semaglutide, and empagliflozin, currently held and resume at discharge. Mildly uncontrolled on SSI, change to moderate sensitivity and adjust insulins as needed  Body mass index is 43.73 kg/m./Very morbid obesity Lifestyle modifications and outpatient follow-up.   ACP Documents: None present DVT prophylaxis: SCDs Start: 09/07/22 0159     Code Status: Full Code:  Family Communication: None at bedside Disposition:  Status is: Inpatient Remains inpatient appropriate because: IV antibiotics     Consultants:     Procedures:     Antimicrobials:   As above   Subjective:  Elevated along with patient's RN in the room.  History as noted above.  Not on home oxygen.  Former smoker.  Works as a Scientific laboratory technician.  Nose feels stuffy.  Cough with white sputum.  No dyspnea at rest or on walking to the bathroom.  Has not done more walking.  Objective:   Vitals:   09/10/22 0543 09/10/22 0544 09/10/22 0810 09/10/22 0830  BP: (!) 173/90   (!) 177/91  Pulse: 79   (!)  105  Resp: 18   16  Temp: 99.9 F (37.7 C)   98.8 F (37.1 C)  TempSrc: Oral   Oral  SpO2: 93%  94% 93%  Weight:  119.2 kg    Height:        General exam: Elderly female, moderately built and obese sitting up comfortably at edge of  bed. Respiratory system: Reduced breath sounds bilaterally with occasional posterior rhonchi but no crackles.  No increased work of breathing.  On 3 L/min [place] oxygen. Cardiovascular system: S1 & S2 heard, RRR. No JVD, murmurs, rubs, gallops or clicks.  Trace bilateral ankle edema. Gastrointestinal system: Abdomen is nondistended, soft and nontender. No organomegaly or masses felt. Normal bowel sounds heard. Central nervous system: Alert and oriented. No focal neurological deficits. Extremities: Symmetric 5 x 5 power. Skin: No rashes, lesions or ulcers Psychiatry: Judgement and insight appear normal. Mood & affect appropriate.     Data Reviewed:   I have personally reviewed following labs and imaging studies   CBC: Recent Labs  Lab 09/06/22 1919 09/07/22 0613 09/08/22 0846 09/09/22 0420 09/10/22 0732  WBC 14.3* 11.2* 10.0 10.3 10.3  NEUTROABS 11.8* 8.3*  --   --  6.8  HGB 12.8 12.1 12.1 12.1 11.5*  HCT 39.8 38.4 39.3 39.4 38.5  MCV 80.7 82.9 85.4 85.1 84.2  PLT 181 165 187 201 219    Basic Metabolic Panel: Recent Labs  Lab 09/06/22 1919 09/07/22 0613 09/08/22 0846 09/09/22 0420 09/10/22 0732  NA 140 140 141 143 140  K 3.3* 3.2* 3.7 4.5 4.4  CL 97* 99 100 98 100  CO2 35* 34* 31 31 32  GLUCOSE 208* 161* 171* 185* 175*  BUN 26* 23 15 14 10   CREATININE 0.95 1.25* 0.75 0.85 0.65  CALCIUM 8.9 7.9* 7.9* 8.2* 8.3*  MG  --  2.2 2.2  --  2.0  PHOS  --  3.9  --   --  2.5    Liver Function Tests: Recent Labs  Lab 09/07/22 5573 09/08/22 0846 09/09/22 0420 09/10/22 0732  AST 26 26 23 15   ALT 28 32 29 24  ALKPHOS 60 61 61 59  BILITOT 0.7 0.7 0.5 0.6  PROT 6.8 7.1 7.0 6.8  ALBUMIN 2.7* 2.6* 2.6* 2.6*    CBG: Recent Labs  Lab 09/09/22 1644 09/09/22 2125 09/10/22 0831  GLUCAP 179* 183* 225*    Microbiology Studies:   Recent Results (from the past 240 hour(s))  Culture, blood (routine x 2)     Status: None (Preliminary result)   Collection Time: 09/06/22   6:56 PM   Specimen: BLOOD  Result Value Ref Range Status   Specimen Description BLOOD RIGHT ANTECUBITAL  Final   Special Requests   Final    BOTTLES DRAWN AEROBIC AND ANAEROBIC Blood Culture adequate volume   Culture   Final    NO GROWTH 4 DAYS Performed at [facility], [address] [place], [place] [address]    Report Status PENDING  Incomplete  Resp panel by RT-PCR (RSV, Flu A&B, Covid) Anterior Nasal Swab     Status: None   Collection Time: 09/06/22  7:26 PM   Specimen: Anterior Nasal Swab  Result Value Ref Range Status   SARS Coronavirus 2 by RT PCR NEGATIVE NEGATIVE Final    Comment: (NOTE) SARS-CoV-2 target nucleic acids are NOT DETECTED.  The SARS-CoV-2 RNA is generally detectable in upper respiratory specimens during the acute phase of infection. The lowest concentration of  SARS-CoV-2 viral copies this assay can detect is 138 copies/mL. A negative result does not preclude SARS-Cov-2 infection and should not be used as the sole basis for treatment or other patient management decisions. A negative result may occur with  improper specimen collection/handling, submission of specimen other than nasopharyngeal swab, presence of viral mutation(s) within the areas targeted by this assay, and inadequate number of viral copies(<138 copies/mL). A negative result must be combined with clinical observations, patient history, and epidemiological information. The expected result is Negative.  Fact Sheet for Patients:  BloggerCourse.com  Fact Sheet for Healthcare Providers:  SeriousBroker.it  This test is no t yet approved or cleared by the [place] FDA and  has been authorized for detection and/or diagnosis of SARS-CoV-2 by FDA under an Emergency Use Authorization (EUA). This EUA will remain  in effect (meaning this test can be used) for the duration of the COVID-19 declaration under Section 564(b)(1) of the Act,  21 U.S.C.section 360bbb-3(b)(1), unless the authorization is terminated  or revoked sooner.       Influenza A by PCR NEGATIVE NEGATIVE Final   Influenza B by PCR NEGATIVE NEGATIVE Final    Comment: (NOTE) The Xpert Xpress SARS-CoV-2/FLU/RSV plus assay is intended as an aid in the diagnosis of influenza from Nasopharyngeal swab specimens and should not be used as a sole basis for treatment. Nasal washings and aspirates are unacceptable for Xpert Xpress SARS-CoV-2/FLU/RSV testing.  Fact Sheet for Patients: BloggerCourse.com  Fact Sheet for Healthcare Providers: SeriousBroker.it  This test is not yet approved or cleared by the [place] FDA and has been authorized for detection and/or diagnosis of SARS-CoV-2 by FDA under an Emergency Use Authorization (EUA). This EUA will remain in effect (meaning this test can be used) for the duration of the COVID-19 declaration under Section 564(b)(1) of the Act, 21 U.S.C. section 360bbb-3(b)(1), unless the authorization is terminated or revoked.     Resp Syncytial Virus by PCR NEGATIVE NEGATIVE Final    Comment: (NOTE) Fact Sheet for Patients: BloggerCourse.com  Fact Sheet for Healthcare Providers: SeriousBroker.it  This test is not yet approved or cleared by the [place] FDA and has been authorized for detection and/or diagnosis of SARS-CoV-2 by FDA under an Emergency Use Authorization (EUA). This EUA will remain in effect (meaning this test can be used) for the duration of the COVID-19 declaration under Section 564(b)(1) of the Act, 21 U.S.C. section 360bbb-3(b)(1), unless the authorization is terminated or revoked.  Performed at [facility], [address] [place], [place] [address]   Respiratory (~20 pathogens) panel by PCR     Status: None   Collection Time: 09/07/22  7:20 AM   Specimen:  Nasopharyngeal Swab; Respiratory  Result Value Ref Range Status   Adenovirus NOT DETECTED NOT DETECTED Final   Coronavirus 229E NOT DETECTED NOT DETECTED Final    Comment: (NOTE) The Coronavirus on the Respiratory Panel, DOES NOT test for the novel  Coronavirus (2019 nCoV)    Coronavirus HKU1 NOT DETECTED NOT DETECTED Final   Coronavirus NL63 NOT DETECTED NOT DETECTED Final   Coronavirus OC43 NOT DETECTED NOT DETECTED Final   Metapneumovirus NOT DETECTED NOT DETECTED Final   Rhinovirus / Enterovirus NOT DETECTED NOT DETECTED Final   Influenza A NOT DETECTED NOT DETECTED Final   Influenza B NOT DETECTED NOT DETECTED Final   Parainfluenza Virus 1 NOT DETECTED NOT DETECTED Final   Parainfluenza Virus 2 NOT DETECTED NOT DETECTED Final   Parainfluenza Virus 3 NOT DETECTED  NOT DETECTED Final   Parainfluenza Virus 4 NOT DETECTED NOT DETECTED Final   Respiratory Syncytial Virus NOT DETECTED NOT DETECTED Final   Bordetella pertussis NOT DETECTED NOT DETECTED Final   Bordetella Parapertussis NOT DETECTED NOT DETECTED Final   Chlamydophila pneumoniae NOT DETECTED NOT DETECTED Final   Mycoplasma pneumoniae NOT DETECTED NOT DETECTED Final    Comment: Performed at [facility], [address] [place], [place] [address]    Radiology Studies:  DG CHEST PORT 1 VIEW  Result Date: 09/10/2022 CLINICAL DATA:  Shortness of breath EXAM: PORTABLE CHEST 1 VIEW COMPARISON:  09/09/2022 FINDINGS: Small bilateral pleural effusions. Associated lower lobe opacities, favoring pneumonia over interstitial edema, although mildly improved. The heart is top-normal in size. IMPRESSION: Small bilateral pleural effusions. Associated lower lobe opacities, favoring pneumonia over interstitial edema, although mildly improved. Electronically Signed   By: Charline Bills M.D.   On: 09/10/2022 10:22   DG CHEST PORT 1 VIEW  Result Date: 09/09/2022 CLINICAL DATA:  Shortness of breath.  Weakness. EXAM: PORTABLE CHEST 1  VIEW COMPARISON:  One-view chest x-ray 09/06/2022 FINDINGS: The heart is enlarged. Progressive diffuse interstitial and airspace opacities are present, right greater than left. Moderate right and small left pleural effusions are noted. IMPRESSION: Progressive diffuse interstitial and airspace opacities, right greater than left. Findings are concerning for congestive heart failure. Infection is not excluded. Electronically Signed   By: Marin Roberts M.D.   On: 09/09/2022 13:21    Scheduled Meds:    enoxaparin (LOVENOX) injection  60 mg Subcutaneous Daily   furosemide  40 mg Intravenous BID   guaiFENesin  1,200 mg Oral BID   insulin aspart  0-5 Units Subcutaneous QHS   insulin aspart  0-9 Units Subcutaneous TID WC   ipratropium-albuterol  3 mL Nebulization TID   potassium chloride  40 mEq Oral BID   prednisoLONE acetate  1 drop Right Eye Daily    Continuous Infusions:    azithromycin 500 mg (09/09/22 2121)   cefTRIAXone (ROCEPHIN)  IV 2 g (09/09/22 2119)     LOS: 3 days     Marcellus Scott, MD,  FACP, [facility], [facility], [facility], [facility]   [facility] [place]     To contact the attending provider between 7A-7P or the covering provider during after hours 7P-7A, please log into the web site [email or web address] and access using universal  password for that web site. If you do not have the password, please call the hospital operator.  09/10/2022, 3:20 PM

## 2022-09-10 NOTE — Care Management Important Message (Signed)
Important Message  Patient Details  Name: [name] MRN: [id number] Date of Birth: 05/14/1950   Medicare Important Message Given:  Yes     Bralin Garry Stefan Church 09/10/2022, 3:05 PM

## 2022-09-11 ENCOUNTER — Inpatient Hospital Stay (HOSPITAL_COMMUNITY): Payer: Medicare PPO

## 2022-09-11 DIAGNOSIS — M79661 Pain in right lower leg: Secondary | ICD-10-CM | POA: Diagnosis not present

## 2022-09-11 DIAGNOSIS — J9601 Acute respiratory failure with hypoxia: Secondary | ICD-10-CM | POA: Diagnosis not present

## 2022-09-11 LAB — GLUCOSE, CAPILLARY
Glucose-Capillary: 201 mg/dL — ABNORMAL HIGH (ref 70–99)
Glucose-Capillary: 254 mg/dL — ABNORMAL HIGH (ref 70–99)
Glucose-Capillary: 175 mg/dL — ABNORMAL HIGH (ref 70–99)
Glucose-Capillary: 188 mg/dL — ABNORMAL HIGH (ref 70–99)

## 2022-09-11 LAB — D-DIMER, QUANTITATIVE: D-Dimer, Quant: 1.05 ug{FEU}/mL — ABNORMAL HIGH (ref 0.00–0.50)

## 2022-09-11 MED ORDER — INSULIN GLARGINE-YFGN 100 UNIT/ML ~~LOC~~ SOLN
5.0000 [IU] | Freq: Every day | SUBCUTANEOUS | Status: DC
  Administered 2022-09-11 – 2022-09-12 (×2): 5 [IU] via SUBCUTANEOUS
  Filled 2022-09-11 (×2): qty 0.05

## 2022-09-11 MED ORDER — IOHEXOL 350 MG/ML SOLN
75.0000 mL | Freq: Once | INTRAVENOUS | Status: AC | PRN
  Administered 2022-09-11: 75 mL via INTRAVENOUS

## 2022-09-11 NOTE — Plan of Care (Signed)

## 2022-09-11 NOTE — Progress Notes (Signed)
Lower extremity venous right study completed.   Please see CV Proc for preliminary results.   Rachel Hodge, RDMS, RVT  

## 2022-09-11 NOTE — Progress Notes (Signed)
SATURATION QUALIFICATIONS: (This note is used to comply with regulatory documentation for home oxygen)  Patient Saturations on Room Air at Rest = 94%  Patient Saturations on Room Air while Ambulating = 88%  Patient Saturations on 2 Liters of oxygen while Ambulating = 92%  Please briefly explain why patient needs home oxygen: Patient becomes tachypneic has dyspnea with exertion, spo2 goes to 88% without oxygen and goes above 90% with 2l of O2

## 2022-09-11 NOTE — Progress Notes (Addendum)
PROGRESS NOTE   [name]  RUE:[id number]    DOB: 12/18/1950    DOA: 09/06/2022  PCP: Alwyn Pea, MD   I have briefly reviewed patients previous medical [facility].  Chief Complaint  Patient presents with   Shortness of Breath    Brief Narrative:  72 year old female, retired Runner, broadcasting/film/video, works Armed forces operational officer, lives with her daughter, independent, PMH of asthma/?  COPD, HTN, former smoker, presented to the ED on 8/22 with complaints of worsening dyspnea with cough, fever and chills.  NAD, febrile to 101 F, transiently tachycardic, occasional tachypnea, hypoxia with oxygen saturation of 66% on room air requiring 3 L/min [place] oxygen.  She reports that her symptoms started a week after getting shingles vaccine.  Admitted for acute respiratory failure with hypoxia due to pneumonia and COPD.   Assessment & Plan:  Principal Problem:   Pneumonia Active Problems:   Asthma, chronic   HTN (hypertension)   Acute respiratory failure with hypoxia Acute bronchitis with asthma versus COPD flare Suspected pneumonia, bacterial versus?  Viral History as noted above Not on home oxygen Hypoxic to 66% on room air in ED requiring 3 L/min [place] oxygen to bring oxygen saturation up to 92% BNP 53 > 101, HS Troponin 11, lactate 1, procalcitonin 0.27 > 0.1, WBC 14.3, influenza A, B, RSV, SARS coronavirus by R2 PCR negative, respiratory panel by PCR negative.  HIV screen negative.  Urine Legionella antigen negative. Chest x-ray 8/22: Enlarged cardiopericardial silhouette with vascular congestion.   Small pleural effusions with adjacent opacity, right-greater-than-left. Atelectasis versus infiltrate.  IV ceftriaxone and azithromycin, completed 5 days course on 8/26, discontinued.  Chest x-ray 8/25: Small bilateral pleural effusions, associated lower lobe opacities, favoring pneumonia over interstitial edema although mildly improved.   Clinically does not appear to be volume overloaded, -3.1 L thus far,  DC IV Lasix Incentive spirometry, flutter valve, trial of low-dose prednisone 30 Mg daily, added Brovana and Pulmicort nebs Recommend repeating chest x-ray in 4 weeks to ensure resolution of abnormal findings Consider pulmonology consultation outpatient for formal PFTs. 8/26: Persisting hypoxia, O2 sats 92% on 1.5 L at rest, dropped to 70% on ambulation without oxygen and patient became dizzy. Although low index of suspicion for PE, 8/27 complained of right leg pain, positive D-dimer, obtain CTA chest: No PE.  Small right and trace left pleural effusions with dependent atelectasis or infiltrate in both lung bases.  RLE venous Doppler to rule out DVT is pending. As per home O2 evaluation note from RN, at this time qualifies for home O2 2 L/min but this will need to be rechecked prior to discharge.  ?  Acute diastolic CHF TTE: LVEF 55-60% grade 1 diastolic dysfunction S/p IV Lasix 40 mg x 2 doses -3.8 L thus far Clinically appears euvolemic, IV Lasix discontinued Continue to hold diuretics, reassess volume status tomorrow.  AKI Resolved  Hypokalemia Replaced   DM2 uncontrolled with hyperglycemia Hemoglobin A1c 8.4, poorly controlled, will need outpatient follow-up for DM med adjustments At home is on sitagliptin, semaglutide, and empagliflozin, currently held and resume at discharge. Mildly uncontrolled on SSI, change to moderate sensitivity and adjust insulins as needed.  CBGs now in the 200s, most likely secondary to steroids Will add low-dose Semglee 5 units daily starting now.  Body mass index is 44.65 kg/m./Very morbid obesity Lifestyle modifications and outpatient follow-up.   ACP Documents: None present DVT prophylaxis: SCDs Start: 09/07/22 0159     Code Status: Full Code:  Family Communication: None  at bedside Disposition:  Status is: Inpatient Remains inpatient appropriate because: Ongoing hypoxia, needing further evaluation and active inpatient management.      Consultants:     Procedures:     Antimicrobials:   As above   Subjective:  Denies dyspnea at rest.  States that yesterday when she was walking without oxygen, felt dizzy and had drop in oxygen.  Reports right leg pain, no blunt trauma, travel in the last couple of weeks to see her sister by car, 3-4 hours out.  Denies prior history of VTE.  Advised her regarding CTA chest, understands risks and benefits and willing to proceed.  Patient evaluated along with her female RN in the room.  Objective:   Vitals:   09/11/22 0513 09/11/22 0803 09/11/22 0910 09/11/22 1219  BP: (!) 165/95  (!) 148/71 (!) 155/90  Pulse: 81 78 86 81  Resp: 18 18 20  (!) 21  Temp: 97.9 F (36.6 C)  99.5 F (37.5 C) 98.8 F (37.1 C)  TempSrc: Oral  Oral Oral  SpO2: 94% 92% 90% 94%  Weight:      Height:        General exam: Elderly female, moderately built and obese sitting up comfortably at edge of bed.  Was on 1 L/min [place] oxygen Respiratory system: Slightly distant breath sounds but clear to auscultation without wheezing, rhonchi or crackles.  No increased work of breathing. Cardiovascular system: S1 & S2 heard, RRR. No JVD, murmurs, rubs, gallops or clicks.  No ankle edema. Gastrointestinal system: Abdomen is nondistended, soft and nontender. No organomegaly or masses felt. Normal bowel sounds heard. Central nervous system: Alert and oriented. No focal neurological deficits. Extremities: Symmetric 5 x 5 power.  Right leg slightly warm, no redness or tenderness, compared to left leg. Skin: No rashes, lesions or ulcers Psychiatry: Judgement and insight appear normal. Mood & affect appropriate.     Data Reviewed:   I have personally reviewed following labs and imaging studies   CBC: Recent Labs  Lab 09/06/22 1919 09/07/22 0613 09/08/22 0846 09/09/22 0420 09/10/22 0732  WBC 14.3* 11.2* 10.0 10.3 10.3  NEUTROABS 11.8* 8.3*  --   --  6.8  HGB 12.8 12.1 12.1 12.1 11.5*  HCT 39.8 38.4 39.3 39.4  38.5  MCV 80.7 82.9 85.4 85.1 84.2  PLT 181 165 187 201 219    Basic Metabolic Panel: Recent Labs  Lab 09/06/22 1919 09/07/22 0613 09/08/22 0846 09/09/22 0420 09/10/22 0732  NA 140 140 141 143 140  K 3.3* 3.2* 3.7 4.5 4.4  CL 97* 99 100 98 100  CO2 35* 34* 31 31 32  GLUCOSE 208* 161* 171* 185* 175*  BUN 26* 23 15 14 10   CREATININE 0.95 1.25* 0.75 0.85 0.65  CALCIUM 8.9 7.9* 7.9* 8.2* 8.3*  MG  --  2.2 2.2  --  2.0  PHOS  --  3.9  --   --  2.5    Liver Function Tests: Recent Labs  Lab 09/07/22 0613 09/08/22 0846 09/09/22 0420 09/10/22 0732  AST 26 26 23 15   ALT 28 32 29 24  ALKPHOS 60 61 61 59  BILITOT 0.7 0.7 0.5 0.6  PROT 6.8 7.1 7.0 6.8  ALBUMIN 2.7* 2.6* 2.6* 2.6*    CBG: Recent Labs  Lab 09/10/22 2048 09/11/22 0834 09/11/22 1214  GLUCAP 264* 201* 254*    Microbiology Studies:   Recent Results (from the past 240 hour(s))  Culture, blood (routine x 2)  Status: None (Preliminary result)   Collection Time: 09/06/22  6:56 PM   Specimen: BLOOD  Result Value Ref Range Status   Specimen Description BLOOD RIGHT ANTECUBITAL  Final   Special Requests   Final    BOTTLES DRAWN AEROBIC AND ANAEROBIC Blood Culture adequate volume   Culture   Final    NO GROWTH 4 DAYS Performed at [facility], [address], [place], [place] [address]    Report Status PENDING  Incomplete  Resp panel by RT-PCR (RSV, Flu A&B, Covid) Anterior Nasal Swab     Status: None   Collection Time: 09/06/22  7:26 PM   Specimen: Anterior Nasal Swab  Result Value Ref Range Status   SARS Coronavirus 2 by RT PCR NEGATIVE NEGATIVE Final    Comment: (NOTE) SARS-CoV-2 target nucleic acids are NOT DETECTED.  The SARS-CoV-2 RNA is generally detectable in upper respiratory specimens during the acute phase of infection. The lowest concentration of SARS-CoV-2 viral copies this assay can detect is 138 copies/mL. A negative result does not preclude SARS-Cov-2 infection and should not  be used as the sole basis for treatment or other patient management decisions. A negative result may occur with  improper specimen collection/handling, submission of specimen other than nasopharyngeal swab, presence of viral mutation(s) within the areas targeted by this assay, and inadequate number of viral copies(<138 copies/mL). A negative result must be combined with clinical observations, patient history, and epidemiological information. The expected result is Negative.  Fact Sheet for Patients:  BloggerCourse.com  Fact Sheet for Healthcare Providers:  SeriousBroker.it  This test is no t yet approved or cleared by the [place] FDA and  has been authorized for detection and/or diagnosis of SARS-CoV-2 by FDA under an Emergency Use Authorization (EUA). This EUA will remain  in effect (meaning this test can be used) for the duration of the COVID-19 declaration under Section 564(b)(1) of the Act, 21 U.S.C.section 360bbb-3(b)(1), unless the authorization is terminated  or revoked sooner.       Influenza A by PCR NEGATIVE NEGATIVE Final   Influenza B by PCR NEGATIVE NEGATIVE Final    Comment: (NOTE) The Xpert Xpress SARS-CoV-2/FLU/RSV plus assay is intended as an aid in the diagnosis of influenza from Nasopharyngeal swab specimens and should not be used as a sole basis for treatment. Nasal washings and aspirates are unacceptable for Xpert Xpress SARS-CoV-2/FLU/RSV testing.  Fact Sheet for Patients: BloggerCourse.com  Fact Sheet for Healthcare Providers: SeriousBroker.it  This test is not yet approved or cleared by the [place] FDA and has been authorized for detection and/or diagnosis of SARS-CoV-2 by FDA under an Emergency Use Authorization (EUA). This EUA will remain in effect (meaning this test can be used) for the duration of the COVID-19 declaration under Section  564(b)(1) of the Act, 21 U.S.C. section 360bbb-3(b)(1), unless the authorization is terminated or revoked.     Resp Syncytial Virus by PCR NEGATIVE NEGATIVE Final    Comment: (NOTE) Fact Sheet for Patients: BloggerCourse.com  Fact Sheet for Healthcare Providers: SeriousBroker.it  This test is not yet approved or cleared by the [place] FDA and has been authorized for detection and/or diagnosis of SARS-CoV-2 by FDA under an Emergency Use Authorization (EUA). This EUA will remain in effect (meaning this test can be used) for the duration of the COVID-19 declaration under Section 564(b)(1) of the Act, 21 U.S.C. section 360bbb-3(b)(1), unless the authorization is terminated or revoked.  Performed at [facility], [address]  [place], [place] [address]   Respiratory (~20 pathogens) panel by PCR     Status: None   Collection Time: 09/07/22  7:20 AM   Specimen: Nasopharyngeal Swab; Respiratory  Result Value Ref Range Status   Adenovirus NOT DETECTED NOT DETECTED Final   Coronavirus 229E NOT DETECTED NOT DETECTED Final    Comment: (NOTE) The Coronavirus on the Respiratory Panel, DOES NOT test for the novel  Coronavirus (2019 nCoV)    Coronavirus HKU1 NOT DETECTED NOT DETECTED Final   Coronavirus NL63 NOT DETECTED NOT DETECTED Final   Coronavirus OC43 NOT DETECTED NOT DETECTED Final   Metapneumovirus NOT DETECTED NOT DETECTED Final   Rhinovirus / Enterovirus NOT DETECTED NOT DETECTED Final   Influenza A NOT DETECTED NOT DETECTED Final   Influenza B NOT DETECTED NOT DETECTED Final   Parainfluenza Virus 1 NOT DETECTED NOT DETECTED Final   Parainfluenza Virus 2 NOT DETECTED NOT DETECTED Final   Parainfluenza Virus 3 NOT DETECTED NOT DETECTED Final   Parainfluenza Virus 4 NOT DETECTED NOT DETECTED Final   Respiratory Syncytial Virus NOT DETECTED NOT DETECTED Final   Bordetella pertussis NOT DETECTED NOT  DETECTED Final   Bordetella Parapertussis NOT DETECTED NOT DETECTED Final   Chlamydophila pneumoniae NOT DETECTED NOT DETECTED Final   Mycoplasma pneumoniae NOT DETECTED NOT DETECTED Final    Comment: Performed at [facility], [address], [place], [place] [address]    Radiology Studies:  DG CHEST PORT 1 VIEW  Result Date: 09/10/2022 CLINICAL DATA:  Shortness of breath EXAM: PORTABLE CHEST 1 VIEW COMPARISON:  09/09/2022 FINDINGS: Small bilateral pleural effusions. Associated lower lobe opacities, favoring pneumonia over interstitial edema, although mildly improved. The heart is top-normal in size. IMPRESSION: Small bilateral pleural effusions. Associated lower lobe opacities, favoring pneumonia over interstitial edema, although mildly improved. Electronically Signed   By: Charline Bills M.D.   On: 09/10/2022 10:22    Scheduled Meds:    arformoterol  15 mcg Nebulization BID   budesonide (PULMICORT) nebulizer solution  0.25 mg Nebulization BID   diclofenac Sodium  4 g Topical QID   enoxaparin (LOVENOX) injection  60 mg Subcutaneous Daily   guaiFENesin  1,200 mg Oral BID   insulin aspart  0-15 Units Subcutaneous TID WC   insulin aspart  0-5 Units Subcutaneous QHS   prednisoLONE acetate  1 drop Right Eye Daily   predniSONE  30 mg Oral Q breakfast    Continuous Infusions:    azithromycin 500 mg (09/10/22 2219)   cefTRIAXone (ROCEPHIN)  IV 2 g (09/10/22 2126)     LOS: 4 days     Marcellus Scott, MD,  FACP, [facility], [facility], [facility], [facility]   [facility] [place]     To contact the attending provider between 7A-7P or the covering provider during after hours 7P-7A, please log into the web site [email or web address] and access using universal [place] password for that web site. If you do not have the password, please call the hospital operator.  09/11/2022, 1:32 PM

## 2022-09-11 NOTE — TOC Initial Note (Signed)
Transition of Care [facility]) - Initial/Assessment Note    Patient Details  Name: [name] MRN: [id number] Date of Birth: 1950/10/19  [facility] CM/SW Contact:    Janae Bridgeman, RN Phone Number: 09/11/2022, 3:33 PM  Clinical Narrative:                 CM met with the patient at the bedside to discuss TOC needs for home - pending likely to home tomorrow.  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  The patient is normally independent.  She is a non-smoker.  No DME at the home.  The patient will be re-evaluated for oxygen needs for home tomorrow per attending MD.  The patient is currently on room air but oxygen saturation drops to 88 percent on RA with ambulation per nursing.  CM will continue to follow the patient for possible need for home oxygen tomorrow - patient will have repeat ambulation test tomorrow to determine.  Patient returned from radiology this afternoon.  Expected Discharge Plan: Home/Self Care Barriers to Discharge: Continued Medical Work up   Patient Goals and CMS Choice Patient states their goals for this hospitalization and ongoing recovery are:: To return home CMS Medicare.gov Compare Post Acute Care list provided to:: Patient Choice offered to / list presented to : Patient [place] ownership interest in [facility].provided to:: Patient    Expected Discharge Plan and Services   Discharge Planning Services: CM Consult Post Acute Care Choice: Resumption of Svcs/PTA Provider Living arrangements for the past 2 months: Single Family Home                                      Prior Living Arrangements/Services Living arrangements for the past 2 months: Single Family Home Lives with:: Adult Children (lives with daughter, [name]) Patient language and need for interpreter reviewed:: Yes Do you feel safe going back to the  place where you live?: Yes      Need for Family Participation in Patient Care: Yes (Comment) Care giver support system in place?: Yes (comment)   Criminal Activity/Legal Involvement Pertinent to Current Situation/Hospitalization: No - Comment as needed  Activities of Daily Living Home Assistive Devices/Equipment: None ADL Screening (condition at time of admission) Patient's cognitive ability adequate to safely complete daily activities?: Yes Is the patient deaf or have difficulty hearing?: No Does the patient have difficulty seeing, even when wearing glasses/contacts?: No Does the patient have difficulty concentrating, remembering, or making decisions?: No Patient able to express need for assistance with ADLs?: Yes Does the patient have difficulty dressing or bathing?: No Independently performs ADLs?: Yes (appropriate for developmental age) Does the patient have difficulty walking or climbing stairs?: No Weakness of Legs: None Weakness of Arms/Hands: None  Permission Sought/Granted Permission sought to share information with : Case Manager, Family Supports, Oceanographer granted to share information with : Yes, Verbal Permission Granted              Emotional Assessment Appearance:: Appears stated age Attitude/Demeanor/Rapport: Gracious Affect (typically observed): Accepting Orientation: : Oriented to Self, Oriented to Place, Oriented to  Time, Oriented to Situation Alcohol / Substance Use: Alcohol Use (only occasional use of ETOH socially - rarely uses, denies smoking) Psych Involvement: No (comment)  Admission diagnosis:  Pneumonia [J18.9]  Patient Active Problem List   Diagnosis Date Noted   Asthma, chronic 09/07/2022   HTN (hypertension) 09/07/2022   Pneumonia 09/06/2022   PCP:  Alwyn Pea, MD Pharmacy:   [id number] - , [place] - [address] [place] [address] Phone: [phone] Fax:  [phone]     Social Determinants of Health (SDOH) Social History: SDOH Screenings   Food Insecurity: No Food Insecurity (09/07/2022)  Housing: Low Risk  (09/07/2022)  Transportation Needs: No Transportation Needs (09/07/2022)  Utilities: Not At Risk (09/07/2022)  Tobacco Use: Low Risk  (09/07/2022)   SDOH Interventions:     Readmission Risk Interventions    09/11/2022    3:32 PM  Readmission Risk Prevention Plan  Post Dischage Appt Complete  Medication Screening Complete  Transportation Screening Complete

## 2022-09-11 NOTE — Inpatient Diabetes Management (Signed)
Inpatient Diabetes Program Recommendations  AACE/ADA: New Consensus Statement on Inpatient Glycemic Control (2015)  Target Ranges:  Prepandial:   less than 140 mg/dL      Peak postprandial:   less than 180 mg/dL (1-2 hours)      Critically ill patients:  140 - 180 mg/dL   Lab Results  Component Value Date   GLUCAP 201 (H) 09/11/2022   HGBA1C 8.4 (H) 09/09/2022    Review of Glycemic Control  Latest Reference Range & Units 09/10/22 20:48 09/11/22 08:34  Glucose-Capillary 70 - 99 mg/dL 606 (H) 301 (H)  (H): Data is abnormally high  Diabetes history: DM2 Outpatient Diabetes medications:  Janumet 50-500 mg every day Januvia 100 mg every day Jardiance 25 mg every day  Rybelsus 14 mg QD Current orders for Inpatient glycemic control:  Novolog 0-15 units TID and 0-5 units at bedtime Prednisone 30 mg BID  Inpatient Diabetes Program Recommendations:    Might consider:  Novolog 0-20 units AC&HS while receiving steroids.   Will continue to follow while inpatient.  Thank you, Dulce Sellar, MSN, CDCES Diabetes Coordinator Inpatient Diabetes Program [phone] (team pager from 8a-5p)

## 2022-09-12 DIAGNOSIS — J441 Chronic obstructive pulmonary disease with (acute) exacerbation: Secondary | ICD-10-CM

## 2022-09-12 DIAGNOSIS — J189 Pneumonia, unspecified organism: Secondary | ICD-10-CM | POA: Diagnosis not present

## 2022-09-12 DIAGNOSIS — J45909 Unspecified asthma, uncomplicated: Secondary | ICD-10-CM | POA: Diagnosis not present

## 2022-09-12 DIAGNOSIS — I10 Essential (primary) hypertension: Secondary | ICD-10-CM | POA: Diagnosis not present

## 2022-09-12 LAB — GLUCOSE, CAPILLARY
Glucose-Capillary: 170 mg/dL — ABNORMAL HIGH (ref 70–99)
Glucose-Capillary: 253 mg/dL — ABNORMAL HIGH (ref 70–99)
Glucose-Capillary: 188 mg/dL — ABNORMAL HIGH (ref 70–99)

## 2022-09-12 LAB — BASIC METABOLIC PANEL
Anion gap: 6 (ref 5–15)
BUN: 15 mg/dL (ref 8–23)
CO2: 31 mmol/L (ref 22–32)
Calcium: 8.4 mg/dL — ABNORMAL LOW (ref 8.9–10.3)
Chloride: 99 mmol/L (ref 98–111)
Creatinine, Ser: 0.69 mg/dL (ref 0.44–1.00)
GFR, Estimated: >60 mL/min (ref >60)
Glucose, Bld: 155 mg/dL — ABNORMAL HIGH (ref 70–99)
Potassium: 3.9 mmol/L (ref 3.5–5.1)
Sodium: 136 mmol/L (ref 135–145)

## 2022-09-12 LAB — CULTURE, BLOOD (ROUTINE X 2)
Culture: NO GROWTH
Special Requests: ADEQUATE

## 2022-09-12 LAB — CBC
HCT: 37.2 % (ref 36.0–46.0)
Hemoglobin: 11.3 g/dL — ABNORMAL LOW (ref 12.0–15.0)
MCH: 25.3 pg — ABNORMAL LOW (ref 26.0–34.0)
MCHC: 30.4 g/dL (ref 30.0–36.0)
MCV: 83.2 fL (ref 80.0–100.0)
Platelets: 213 10*3/uL (ref 150–400)
RBC: 4.47 MIL/uL (ref 3.87–5.11)
RDW: 15 % (ref 11.5–15.5)
WBC: 11.2 10*3/uL — ABNORMAL HIGH (ref 4.0–10.5)
nRBC: 0 % (ref 0.0–0.2)

## 2022-09-12 MED ORDER — GUAIFENESIN ER 600 MG PO TB12: 600.0000 mg | ORAL_TABLET | Freq: Two times a day (BID) | ORAL | 0 refills | Status: AC

## 2022-09-12 MED ORDER — IPRATROPIUM-ALBUTEROL 0.5-2.5 (3) MG/3ML IN SOLN: 3.0000 mL | Freq: Four times a day (QID) | RESPIRATORY_TRACT | 1 refills | Status: AC | PRN

## 2022-09-12 MED ORDER — PREDNISONE 10 MG PO TABS: ORAL_TABLET | ORAL | 0 refills | Status: AC

## 2022-09-12 NOTE — Plan of Care (Signed)
  Problem: Education: Goal: Knowledge of General Education information will improve Description: Including pain rating scale, medication(s)/side effects and non-pharmacologic comfort measures Outcome: Progressing   Problem: Health Behavior/Discharge Planning: Goal: Ability to manage health-related needs will improve Outcome: Progressing   Problem: Activity: Goal: Risk for activity intolerance will decrease Outcome: Progressing   

## 2022-09-12 NOTE — Progress Notes (Signed)
    Durable Medical Equipment  (From admission, onward)           Start     Ordered   09/12/22 1537  For home use only DME oxygen  Once       Question Answer Comment  Length of Need 6 Months   Mode or (Route) Nasal cannula   Liters per Minute 2   Frequency Continuous (stationary and portable oxygen unit needed)   Oxygen conserving device Yes   Oxygen delivery system Gas      09/12/22 1536

## 2022-09-12 NOTE — Progress Notes (Signed)
Signed      SATURATION QUALIFICATIONS: (This note is used to comply with regulatory documentation for home oxygen)   Patient Saturations on Room Air at Rest = 91%   Patient Saturations on Room Air while Ambulating = 84%   Patient Saturations on 2 Liters of oxygen while Ambulating = 91%   Please briefly explain why patient needs home oxygen: While ambulating on room air, patient O2 sat dropped to 84%. When ambulating on 2L, O2 sat was 91%.

## 2022-09-12 NOTE — Discharge Summary (Addendum)
Physician Discharge Summary   Patient: [name] MRN: [id number] DOB: 1950/09/11  Admit date:     09/06/2022  Discharge date: 09/12/22  Discharge Physician: Meredeth Ide   PCP: Alwyn Pea, MD   Recommendations at discharge:   Will be discharged home on oxygen 3 L/min  Discharge Diagnoses: Principal Problem:   Pneumonia Active Problems:   Asthma, chronic   HTN (hypertension)  Resolved Problems:   * No resolved hospital problems. [facility] Course:  72 year old female, retired Runner, broadcasting/film/video, works Armed forces operational officer, lives with her daughter, independent, PMH of asthma/? COPD, HTN, former smoker, presented to the ED on 8/22 with complaints of worsening dyspnea with cough, fever and chills. NAD, febrile to 101 F, transiently tachycardic, occasional tachypnea, hypoxia with oxygen saturation of 66% on room air requiring 3 L/min [place] oxygen. She reports that her symptoms started a week after getting shingles vaccine. Admitted for acute respiratory failure with hypoxia due to pneumonia and COPD.   Assessment and Plan:  Acute respiratory failure with hypoxia Acute bronchitis with asthma versus COPD flare Suspected pneumonia, bacterial versus?  Viral History as noted above Not on home oxygen Hypoxic to 66% on room air in ED requiring 3 L/min [place] oxygen to bring oxygen saturation up to 92% BNP 53 > 101, HS Troponin 11, lactate 1, procalcitonin 0.27 > 0.1, WBC 14.3, influenza A, B, RSV, SARS coronavirus by R2 PCR negative, respiratory panel by PCR negative.  HIV screen negative.  Urine Legionella antigen negative. Chest x-ray 8/22: Enlarged cardiopericardial silhouette with vascular congestion.   Small pleural effusions with adjacent opacity, right-greater-than-left. Atelectasis versus infiltrate.  IV ceftriaxone and azithromycin, completed 5 days course on 8/26, discontinued.  Chest x-ray 8/25: Small bilateral pleural effusions, associated lower lobe opacities, favoring pneumonia over interstitial  edema although mildly improved.   Clinically does not appear to be volume overloaded, -3.1 L thus far, DC IV Lasix Incentive spirometry, flutter valve, trial of low-dose prednisone 30 Mg daily, added Brovana and Pulmicort nebs Recommend repeating chest x-ray in 4 weeks to ensure resolution of abnormal findings Consider pulmonology consultation outpatient for formal PFTs. 8/26: Persisting hypoxia, O2 sats 92% on 1.5 L at rest, dropped to 70% on ambulation without oxygen and patient became dizzy. Although low index of suspicion for PE, 8/27 complained of right leg pain, positive D-dimer, obtain CTA chest: No PE.  Small right and trace left pleural effusions with dependent atelectasis or infiltrate in both lung bases.   RLE venous Doppler was done which was negative for DVT -Patient will be going home on oxygen 3 L/min -Will discharge on DuoNeb nebulizers every 6 hours as needed -Prednisone taper for 5 days   ?  Acute diastolic CHF TTE: LVEF 55-60% grade 1 diastolic dysfunction S/p IV Lasix 40 mg x 2 doses --4.9 L thus far Clinically appears euvolemic, IV Lasix discontinued    AKI Resolved   Hypokalemia Replaced   DM2 uncontrolled with hyperglycemia Hemoglobin A1c 8.4, poorly controlled, will need outpatient follow-up for DM med adjustments At home is on sitagliptin, semaglutide, and empagliflozin   Body mass index is 44.65 kg/m./Very morbid obesity Lifestyle modifications and outpatient follow-up.        Consultants:  Procedures performed:  Disposition: Home Diet recommendation:  Discharge Diet Orders (From admission, onward)     Start     Ordered   09/12/22 0000  Diet - low sodium heart healthy        09/12/22 1617  Regular diet DISCHARGE MEDICATION: Allergies as of 09/12/2022   No Known Allergies      Medication List     STOP taking these medications    sitaGLIPtin-metformin 50-500 MG tablet Commonly known as: JANUMET   valsartan 40 MG  tablet Commonly known as: DIOVAN       TAKE these medications    albuterol 108 (90 Base) MCG/ACT inhaler Commonly known as: VENTOLIN HFA Inhale 2 puffs into the lungs every 4 (four) hours as needed for wheezing or shortness of breath.   atorvastatin 40 MG tablet Commonly known as: LIPITOR Take 40 mg by mouth daily.   guaiFENesin 600 MG 12 hr tablet Commonly known as: MUCINEX Take 1 tablet (600 mg total) by mouth 2 (two) times daily for 5 days.   ipratropium-albuterol 0.5-2.5 (3) MG/3ML Soln Commonly known as: DUONEB Take 3 mLs by nebulization every 6 (six) hours as needed (Shortness of breath).   Januvia 100 MG tablet Generic drug: sitaGLIPtin Take 100 mg by mouth daily.   Jardiance 25 MG Tabs tablet Generic drug: empagliflozin Take 25 mg by mouth daily.   meloxicam 15 MG tablet Commonly known as: MOBIC Take 15 mg by mouth daily.   Olmesartan-amLODIPine-HCTZ 40-10-12.5 MG Tabs Take 1 tablet by mouth daily.   prednisoLONE acetate 1 % ophthalmic suspension Commonly known as: PRED FORTE Place 1 drop into the right eye daily.   predniSONE 10 MG tablet Commonly known as: DELTASONE Prednisone 40 mg po daily x 1 day then Prednisone 30 mg po daily x 1 day then Prednisone 20 mg po daily x 1 day then Prednisone 10 mg daily x 1 day then stop...   Rybelsus 14 MG Tabs Generic drug: Semaglutide Take 1 tablet by mouth daily.   Vitamin D (Ergocalciferol) 1.25 MG (50000 UNIT) Caps capsule Commonly known as: DRISDOL Take 50,000 Units by mouth every 7 (seven) days.               Durable Medical Equipment  (From admission, onward)           Start     Ordered   09/12/22 1619  For home use only DME Nebulizer machine  Once       Question Answer Comment  Patient needs a nebulizer to treat with the following condition COPD (chronic obstructive pulmonary disease) (HCC)   Length of Need Lifetime      09/12/22 1618   09/12/22 1600  For home use only DME Walker rolling   Once       Question Answer Comment  Walker: With 5 Inch Wheels   Patient needs a walker to treat with the following condition Generalized weakness      09/12/22 1559   09/12/22 1540  For home use only DME oxygen  Once       Question Answer Comment  Length of Need 6 Months   Mode or (Route) Nasal cannula   Liters per Minute 2   Frequency Continuous (stationary and portable oxygen unit needed)   Oxygen conserving device Yes   Oxygen delivery system Gas      09/12/22 1539            Follow-up Information     [facility] Follow up.   Why: [employer] will be providing a portable oxygen tank and concentrator at the home.  A Rolling walker will also be delivered to the room before discharge. Contact information: [address] [place] [address] [phone]  Discharge Exam: Filed Weights   09/10/22 0544 09/11/22 0500 09/12/22 0500  Weight: 119.2 kg 121.7 kg 121.4 kg   General-appears in no acute distress Heart-S1-S2, regular, no murmur auscultated Lungs-clear to auscultation bilaterally, no wheezing or crackles auscultated Abdomen-soft, nontender, no organomegaly Extremities-no edema in the lower extremities Neuro-alert, oriented x3, no focal deficit noted  Condition at discharge: good  The results of significant diagnostics from this hospitalization (including imaging, microbiology, ancillary and laboratory) are listed below for reference.   Imaging Studies: VAS [place] LOWER EXTREMITY VENOUS (DVT)  Result Date: 09/11/2022  Lower Venous DVT Study Patient Name:  KALILA NARO  Date of Exam:   09/11/2022 Medical Rec #: [id number]     Accession #:    [id number] Date of Birth: 23-Dec-1950     Patient Gender: F Patient Age:   9 years Exam Location:  [facility] Procedure:      VAS [place] LOWER EXTREMITY VENOUS (DVT) Referring Phys: ANAND HONGALGI --------------------------------------------------------------------------------  Indications: Right leg  pain.  Comparison Study: No prior studies. Performing Technologist: Jean Rosenthal RDMS, RVT  Examination Guidelines: A complete evaluation includes B-mode imaging, spectral Doppler, color Doppler, and power Doppler as needed of all accessible portions of each vessel. Bilateral testing is considered an integral part of a complete examination. Limited examinations for reoccurring indications may be performed as noted. The reflux portion of the exam is performed with the patient in reverse Trendelenburg.  +---------+---------------+---------+-----------+----------+--------------+ RIGHT    CompressibilityPhasicitySpontaneityPropertiesThrombus Aging +---------+---------------+---------+-----------+----------+--------------+ CFV      Full           Yes      Yes                                 +---------+---------------+---------+-----------+----------+--------------+ SFJ      Full                                                        +---------+---------------+---------+-----------+----------+--------------+ FV Prox  Full                                                        +---------+---------------+---------+-----------+----------+--------------+ FV Mid   Full                                                        +---------+---------------+---------+-----------+----------+--------------+ FV DistalFull                                                        +---------+---------------+---------+-----------+----------+--------------+ PFV      Full                                                        +---------+---------------+---------+-----------+----------+--------------+  POP      Full           Yes      Yes                                 +---------+---------------+---------+-----------+----------+--------------+ PTV      Full                                                         +---------+---------------+---------+-----------+----------+--------------+ PERO     Full                                                        +---------+---------------+---------+-----------+----------+--------------+   +----+---------------+---------+-----------+----------+--------------+ LEFTCompressibilityPhasicitySpontaneityPropertiesThrombus Aging +----+---------------+---------+-----------+----------+--------------+ CFV Full           Yes      Yes                                 +----+---------------+---------+-----------+----------+--------------+     Summary: RIGHT: - There is no evidence of deep vein thrombosis in the lower extremity.  - No cystic structure found in the popliteal fossa.  LEFT: - No evidence of common femoral vein obstruction.   *See table(s) above for measurements and observations. Electronically signed by Waverly Ferrari MD on 09/11/2022 at 3:24:06 PM.    Final    CT Angio Chest Pulmonary Embolism (PE) W or WO Contrast  Result Date: 09/11/2022 CLINICAL DATA:  Positive D-dimer with worsening dyspnea, cough, fever and chills. EXAM: CT ANGIOGRAPHY CHEST WITH CONTRAST TECHNIQUE: Multidetector CT imaging of the chest was performed using the standard protocol during bolus administration of intravenous contrast. Multiplanar CT image reconstructions and MIPs were obtained to evaluate the vascular anatomy. RADIATION DOSE REDUCTION: This exam was performed according to the departmental dose-optimization program which includes automated exposure control, adjustment of the mA and/or kV according to patient size and/or use of iterative reconstruction technique. CONTRAST:  75mL OMNIPAQUE IOHEXOL 350 MG/ML SOLN COMPARISON:  None Available. FINDINGS: Cardiovascular: The heart is enlarged. No substantial pericardial effusion. Mild atherosclerotic calcification is noted in the wall of the thoracic aorta. There is no filling defect within the opacified pulmonary arteries to  suggest the presence of an acute pulmonary embolus. Mediastinum/Nodes: No mediastinal lymphadenopathy. There is no hilar lymphadenopathy. The esophagus has normal imaging features. There is no axillary lymphadenopathy. Lungs/Pleura: Dependent atelectasis or infiltrate is noted in both lung bases. Small right and trace left pleural effusions evident. No suspicious pulmonary nodule or mass. Upper Abdomen: Visualized portion of the upper abdomen is unremarkable. Musculoskeletal: No worrisome lytic or sclerotic osseous abnormality. Review of the MIP images confirms the above findings. IMPRESSION: 1. No CT evidence for acute pulmonary embolus. 2. Small right and trace left pleural effusions with dependent atelectasis or infiltrate in both lung bases. 3.  Aortic Atherosclerosis (ICD10-I70.0). Electronically Signed   By: Kennith Center M.D.   On: 09/11/2022 14:04   DG CHEST PORT 1 VIEW  Result Date: 09/10/2022 CLINICAL DATA:  Shortness of breath EXAM: PORTABLE CHEST 1 VIEW COMPARISON:  09/09/2022 FINDINGS: Small bilateral pleural effusions. Associated lower lobe opacities, favoring pneumonia over interstitial edema, although mildly improved. The heart is top-normal in size. IMPRESSION: Small bilateral pleural effusions. Associated lower lobe opacities, favoring pneumonia over interstitial edema, although mildly improved. Electronically Signed   By: Charline Bills M.D.   On: 09/10/2022 10:22   DG CHEST PORT 1 VIEW  Result Date: 09/09/2022 CLINICAL DATA:  Shortness of breath.  Weakness. EXAM: PORTABLE CHEST 1 VIEW COMPARISON:  One-view chest x-ray 09/06/2022 FINDINGS: The heart is enlarged. Progressive diffuse interstitial and airspace opacities are present, right greater than left. Moderate right and small left pleural effusions are noted. IMPRESSION: Progressive diffuse interstitial and airspace opacities, right greater than left. Findings are concerning for congestive heart failure. Infection is not excluded.  Electronically Signed   By: Marin Roberts M.D.   On: 09/09/2022 13:21   ECHOCARDIOGRAM COMPLETE  Result Date: 09/07/2022    ECHOCARDIOGRAM REPORT   Patient Name:   AAMYAH Crill Date of Exam: 09/07/2022 Medical Rec #:  [id number]    Height:       65.0 in Accession #:    [id number]   Weight:       267.2 lb Date of Birth:  01/01/1951    BSA:          2.237 m Patient Age:    72 years     BP:           128/72 mmHg Patient Gender: F            HR:           90 bpm. Exam Location:  Inpatient Procedure: 2D Echo, Cardiac Doppler and Color Doppler Indications:    CHF I50.9  History:        Patient has no prior history of Echocardiogram examinations.                 Risk Factors:Hypertension.  Sonographer:    Lucendia Herrlich Referring Phys: [id number] MICHAEL E NORINS IMPRESSIONS  1. Left ventricular ejection fraction, by estimation, is 55 to 60%. The left ventricle has normal function. The left ventricle has no regional wall motion abnormalities. Left ventricular diastolic parameters are consistent with Grade I diastolic dysfunction (impaired relaxation).  2. Right ventricular systolic function is normal. The right ventricular size is normal. There is mildly elevated pulmonary artery systolic pressure. The estimated right ventricular systolic pressure is 43.0 mmHg.  3. Right atrial size was mildly dilated.  4. The mitral valve is normal in structure. Trivial mitral valve regurgitation. No evidence of mitral stenosis.  5. The aortic valve is tricuspid. Aortic valve regurgitation is not visualized. No aortic stenosis is present.  6. Aortic dilatation noted. There is mild dilatation of the ascending aorta, measuring 38 mm.  7. The inferior vena cava is dilated in size with >50% respiratory variability, suggesting right atrial pressure of 8 mmHg. FINDINGS  Left Ventricle: Left ventricular ejection fraction, by estimation, is 55 to 60%. The left ventricle has normal function. The left ventricle has no regional wall motion  abnormalities. The left ventricular internal cavity size was normal in size. There is  no left ventricular hypertrophy. Left ventricular diastolic parameters are consistent with Grade I diastolic dysfunction (impaired relaxation). Right Ventricle: The right ventricular size is normal. No increase in right ventricular wall thickness. Right ventricular systolic function is normal. There is mildly elevated pulmonary artery systolic pressure. The tricuspid regurgitant velocity is 2.96  m/s, and with an assumed right atrial  pressure of 8 mmHg, the estimated right ventricular systolic pressure is 43.0 mmHg. Left Atrium: Left atrial size was normal in size. Right Atrium: Right atrial size was mildly dilated. Pericardium: There is no evidence of pericardial effusion. Mitral Valve: The mitral valve is normal in structure. Trivial mitral valve regurgitation. No evidence of mitral valve stenosis. Tricuspid Valve: The tricuspid valve is normal in structure. Tricuspid valve regurgitation is trivial. Aortic Valve: The aortic valve is tricuspid. Aortic valve regurgitation is not visualized. No aortic stenosis is present. Aortic valve peak gradient measures 13.1 mmHg. Pulmonic Valve: The pulmonic valve was normal in structure. Pulmonic valve regurgitation is not visualized. Aorta: The aortic root is normal in size and structure and aortic dilatation noted. There is mild dilatation of the ascending aorta, measuring 38 mm. Venous: The inferior vena cava is dilated in size with greater than 50% respiratory variability, suggesting right atrial pressure of 8 mmHg. IAS/Shunts: No atrial level shunt detected by color flow Doppler.  LEFT VENTRICLE PLAX 2D LVIDd:         4.50 cm   Diastology LVIDs:         2.90 cm   LV e' medial:    14.30 cm/s LV PW:         0.90 cm   LV E/e' medial:  6.0 LV IVS:        1.00 cm   LV e' lateral:   16.30 cm/s LVOT diam:     2.20 cm   LV E/e' lateral: 5.2 LV SV:         112 LV SV Index:   50 LVOT Area:      3.80 cm  RIGHT VENTRICLE             IVC RV S prime:     18.00 cm/s  IVC diam: 2.60 cm TAPSE (M-mode): 2.6 cm LEFT ATRIUM           Index        RIGHT ATRIUM           Index LA diam:      3.80 cm 1.70 cm/m   RA Area:     20.30 cm LA Vol (A2C): 47.3 ml 21.14 ml/m  RA Volume:   52.70 ml  23.56 ml/m LA Vol (A4C): 51.7 ml 23.11 ml/m  AORTIC VALVE AV Area (Vmax): 3.23 cm AV Vmax:        181.00 cm/s AV Peak Grad:   13.1 mmHg LVOT Vmax:      154.00 cm/s LVOT Vmean:     105.367 cm/s LVOT VTI:       0.294 m  AORTA Ao Root diam: 3.00 cm Ao Asc diam:  3.80 cm MITRAL VALVE                TRICUSPID VALVE MV Area (PHT): 4.86 cm     TR Peak grad:   35.0 mmHg MV Decel Time: 156 msec     TR Vmax:        296.00 cm/s MR Peak grad: 72.2 mmHg MR Vmax:      425.00 cm/s   SHUNTS MV E velocity: 85.40 cm/s   Systemic VTI:  0.29 m MV A velocity: 103.00 cm/s  Systemic Diam: 2.20 cm MV E/A ratio:  0.83 Dalton McleanMD Electronically signed by Wilfred Lacy Signature Date/Time: 09/07/2022/3:24:55 PM    Final    DG Chest Port 1 View  Result Date: 09/06/2022 CLINICAL DATA:  Chest pain EXAM: PORTABLE CHEST 1  VIEW COMPARISON:  X-ray 11/27/2007 FINDINGS: Enlarged cardiopericardial silhouette. Small effusion on the right-greater-than-left. Adjacent opacity. Vascular congestion with some edema. No pneumothorax. Degenerative changes of the spine. Overlapping cardiac leads. IMPRESSION: Enlarged cardiopericardial silhouette with vascular congestion. Small pleural effusions with adjacent opacity, right-greater-than-left. Atelectasis versus infiltrate. Recommend follow-up Electronically Signed   By: Karen Kays M.D.   On: 09/06/2022 19:58    Microbiology: Results for orders placed or performed during the hospital encounter of 09/06/22  Culture, blood (routine x 2)     Status: None   Collection Time: 09/06/22  6:56 PM   Specimen: BLOOD  Result Value Ref Range Status   Specimen Description BLOOD RIGHT ANTECUBITAL  Final   Special  Requests   Final    BOTTLES DRAWN AEROBIC AND ANAEROBIC Blood Culture adequate volume   Culture   Final    NO GROWTH 6 DAYS Performed at [facility], [address], [place], [place] [address]    Report Status 09/12/2022 FINAL  Final  Resp panel by RT-PCR (RSV, Flu A&B, Covid) Anterior Nasal Swab     Status: None   Collection Time: 09/06/22  7:26 PM   Specimen: Anterior Nasal Swab  Result Value Ref Range Status   SARS Coronavirus 2 by RT PCR NEGATIVE NEGATIVE Final    Comment: (NOTE) SARS-CoV-2 target nucleic acids are NOT DETECTED.  The SARS-CoV-2 RNA is generally detectable in upper respiratory specimens during the acute phase of infection. The lowest concentration of SARS-CoV-2 viral copies this assay can detect is 138 copies/mL. A negative result does not preclude SARS-Cov-2 infection and should not be used as the sole basis for treatment or other patient management decisions. A negative result may occur with  improper specimen collection/handling, submission of specimen other than nasopharyngeal swab, presence of viral mutation(s) within the areas targeted by this assay, and inadequate number of viral copies(<138 copies/mL). A negative result must be combined with clinical observations, patient history, and epidemiological information. The expected result is Negative.  Fact Sheet for Patients:  BloggerCourse.com  Fact Sheet for Healthcare Providers:  SeriousBroker.it  This test is no t yet approved or cleared by the [place] FDA and  has been authorized for detection and/or diagnosis of SARS-CoV-2 by FDA under an Emergency Use Authorization (EUA). This EUA will remain  in effect (meaning this test can be used) for the duration of the COVID-19 declaration under Section 564(b)(1) of the Act, 21 U.S.C.section 360bbb-3(b)(1), unless the authorization is terminated  or revoked sooner.       Influenza A by PCR  NEGATIVE NEGATIVE Final   Influenza B by PCR NEGATIVE NEGATIVE Final    Comment: (NOTE) The Xpert Xpress SARS-CoV-2/FLU/RSV plus assay is intended as an aid in the diagnosis of influenza from Nasopharyngeal swab specimens and should not be used as a sole basis for treatment. Nasal washings and aspirates are unacceptable for Xpert Xpress SARS-CoV-2/FLU/RSV testing.  Fact Sheet for Patients: BloggerCourse.com  Fact Sheet for Healthcare Providers: SeriousBroker.it  This test is not yet approved or cleared by the [place] FDA and has been authorized for detection and/or diagnosis of SARS-CoV-2 by FDA under an Emergency Use Authorization (EUA). This EUA will remain in effect (meaning this test can be used) for the duration of the COVID-19 declaration under Section 564(b)(1) of the Act, 21 U.S.C. section 360bbb-3(b)(1), unless the authorization is terminated or revoked.     Resp Syncytial Virus by PCR NEGATIVE NEGATIVE Final    Comment: (NOTE) Fact  Sheet for Patients: BloggerCourse.com  Fact Sheet for Healthcare Providers: SeriousBroker.it  This test is not yet approved or cleared by the [place] FDA and has been authorized for detection and/or diagnosis of SARS-CoV-2 by FDA under an Emergency Use Authorization (EUA). This EUA will remain in effect (meaning this test can be used) for the duration of the COVID-19 declaration under Section 564(b)(1) of the Act, 21 U.S.C. section 360bbb-3(b)(1), unless the authorization is terminated or revoked.  Performed at [facility], [address], [place], [place] [address]   Respiratory (~20 pathogens) panel by PCR     Status: None   Collection Time: 09/07/22  7:20 AM   Specimen: Nasopharyngeal Swab; Respiratory  Result Value Ref Range Status   Adenovirus NOT DETECTED NOT DETECTED Final   Coronavirus 229E NOT  DETECTED NOT DETECTED Final    Comment: (NOTE) The Coronavirus on the Respiratory Panel, DOES NOT test for the novel  Coronavirus (2019 nCoV)    Coronavirus HKU1 NOT DETECTED NOT DETECTED Final   Coronavirus NL63 NOT DETECTED NOT DETECTED Final   Coronavirus OC43 NOT DETECTED NOT DETECTED Final   Metapneumovirus NOT DETECTED NOT DETECTED Final   Rhinovirus / Enterovirus NOT DETECTED NOT DETECTED Final   Influenza A NOT DETECTED NOT DETECTED Final   Influenza B NOT DETECTED NOT DETECTED Final   Parainfluenza Virus 1 NOT DETECTED NOT DETECTED Final   Parainfluenza Virus 2 NOT DETECTED NOT DETECTED Final   Parainfluenza Virus 3 NOT DETECTED NOT DETECTED Final   Parainfluenza Virus 4 NOT DETECTED NOT DETECTED Final   Respiratory Syncytial Virus NOT DETECTED NOT DETECTED Final   Bordetella pertussis NOT DETECTED NOT DETECTED Final   Bordetella Parapertussis NOT DETECTED NOT DETECTED Final   Chlamydophila pneumoniae NOT DETECTED NOT DETECTED Final   Mycoplasma pneumoniae NOT DETECTED NOT DETECTED Final    Comment: Performed at [facility] [facility], [address], [place], [place] [address]    Labs: CBC: Recent Labs  Lab 09/06/22 1919 09/07/22 [phone] 09/08/22 0846 09/09/22 0420 09/10/22 0732 09/12/22 0424  WBC 14.3* 11.2* 10.0 10.3 10.3 11.2*  NEUTROABS 11.8* 8.3*  --   --  6.8  --   HGB 12.8 12.1 12.1 12.1 11.5* 11.3*  HCT 39.8 38.4 39.3 39.4 38.5 37.2  MCV 80.7 82.9 85.4 85.1 84.2 83.2  PLT 181 165 187 201 219 213   Basic Metabolic Panel: Recent Labs  Lab 09/07/22 0613 09/08/22 0846 09/09/22 0420 09/10/22 0732 09/12/22 0424  NA 140 141 143 140 136  K 3.2* 3.7 4.5 4.4 3.9  CL 99 100 98 100 99  CO2 34* 31 31 32 31  GLUCOSE 161* 171* 185* 175* 155*  BUN 23 15 14 10 15   CREATININE 1.25* 0.75 0.85 0.65 0.69  CALCIUM 7.9* 7.9* 8.2* 8.3* 8.4*  MG 2.2 2.2  --  2.0  --   PHOS 3.9  --   --  2.5  --    Liver Function Tests: Recent Labs  Lab 09/07/22 0613 09/08/22 0846  09/09/22 0420 09/10/22 0732  AST 26 26 23 15   ALT 28 32 29 24  ALKPHOS 60 61 61 59  BILITOT 0.7 0.7 0.5 0.6  PROT 6.8 7.1 7.0 6.8  ALBUMIN 2.7* 2.6* 2.6* 2.6*   CBG: Recent Labs  Lab 09/11/22 1640 09/11/22 2059 09/12/22 0736 09/12/22 1220 09/12/22 1614  GLUCAP 175* 188* 170* 253* 188*    Discharge time spent: greater than 30 minutes.  Signed: Meredeth Ide, MD  [facility] 09/12/2022

## 2022-09-12 NOTE — TOC Transition Note (Addendum)
Transition of Care [facility]) - CM/SW Discharge Note   Patient Details  Name: [name] MRN: [id number] Date of Birth: 1950-10-06  Transition of [facility]) CM/SW Contact:  Janae Bridgeman, RN Phone Number: 09/12/2022, 4:12 PM   Clinical Narrative:    CM spoke with the patient at the bedside and patient was offered Medicare choice regarding DME company and she did not have a preference.  I called [employer] and requested home oxygen portable tank and RW be delivered to the patient's room.  Bedside nursing will go over discharge instructions and assist with calling the daughter with transportation home once the DME is delivered to the hospital room.  Patient requires ambulation assistance with rolling walker to mobile safely to perform ADL's in the hospital room.  The patient is requiring oxygen at 2L/min [place] while ambulating and is using RW due to her physical deconditioning.  I called [employer] back and requested a nebulizer machine since the patient is requiring nebulized medications for home as documented in the patient's discharge summary.    Final next level of care: Home/Self Care Barriers to Discharge: Continued Medical Work up   Patient Goals and CMS Choice CMS Medicare.gov Compare Post Acute Care list provided to:: Patient Choice offered to / list presented to : Patient  Discharge Placement                         Discharge Plan and Services Additional resources added to the After Visit Summary for     Discharge Planning Services: CM Consult Post Acute Care Choice: Resumption of Svcs/PTA Provider                               Social Determinants of Health (SDOH) Interventions SDOH Screenings   Food Insecurity: No Food Insecurity (09/07/2022)  Housing: Low Risk  (09/07/2022)  Transportation Needs: No Transportation Needs (09/07/2022)  Utilities: Not At Risk (09/07/2022)  Tobacco Use: Low Risk  (09/07/2022)     Readmission Risk Interventions     09/11/2022    3:32 PM  Readmission Risk Prevention Plan  Post Dischage Appt Complete  Medication Screening Complete  Transportation Screening Complete

## 2022-09-12 NOTE — Progress Notes (Signed)
SATURATION QUALIFICATIONS: (This note is used to comply with regulatory documentation for home oxygen)  Patient Saturations on Room Air at Rest = 91%  Patient Saturations on Room Air while Ambulating = 84%  Patient Saturations on 2 Liters of oxygen while Ambulating = 91%  Please briefly explain why patient needs home oxygen: While ambulating on room air, patient O2 sat dropped to 84%. When ambulating on 2L, O2 sat was 91%.
